# Patient Record
Sex: Male | Born: 1935
Health system: Southern US, Community
[De-identification: ages and names within clinical notes are randomized; demographics above are authoritative.]

## PROBLEM LIST (undated history)

## (undated) DIAGNOSIS — F419 Anxiety disorder, unspecified: Secondary | ICD-10-CM

## (undated) DIAGNOSIS — F329 Major depressive disorder, single episode, unspecified: Secondary | ICD-10-CM

## (undated) DIAGNOSIS — F32A Depression, unspecified: Secondary | ICD-10-CM

## (undated) DIAGNOSIS — E785 Hyperlipidemia, unspecified: Secondary | ICD-10-CM

## (undated) DIAGNOSIS — I10 Essential (primary) hypertension: Secondary | ICD-10-CM

## (undated) DIAGNOSIS — G4730 Sleep apnea, unspecified: Secondary | ICD-10-CM

## (undated) DIAGNOSIS — K219 Gastro-esophageal reflux disease without esophagitis: Secondary | ICD-10-CM

## (undated) HISTORY — DX: Hyperlipidemia, unspecified: E78.5

## (undated) HISTORY — DX: Sleep apnea, unspecified: G47.30

## (undated) HISTORY — DX: Anxiety disorder, unspecified: F41.9

## (undated) HISTORY — PX: WISDOM TOOTH EXTRACTION: SHX21

## (undated) HISTORY — DX: Essential (primary) hypertension: I10

## (undated) HISTORY — DX: Gastro-esophageal reflux disease without esophagitis: K21.9

## (undated) HISTORY — DX: Major depressive disorder, single episode, unspecified: F32.9

## (undated) HISTORY — DX: Depression, unspecified: F32.A

---

## 1995-02-27 HISTORY — PX: CYSTOSCOPY PROSTATE W/ LASER: SUR372

## 1997-11-04 ENCOUNTER — Ambulatory Visit (HOSPITAL_COMMUNITY): Admission: RE | Admit: 1997-11-04 | Discharge: 1997-11-04 | Payer: Self-pay | Admitting: Urology

## 2010-02-26 HISTORY — PX: BACK SURGERY: SHX140

## 2013-10-13 ENCOUNTER — Encounter: Payer: Self-pay | Admitting: Podiatrist

## 2013-10-13 ENCOUNTER — Ambulatory Visit (INDEPENDENT_AMBULATORY_CARE_PROVIDER_SITE_OTHER): Payer: Non-veteran care | Admitting: Podiatrist

## 2013-10-13 VITALS — BP 164/77 | HR 56 | Resp 18

## 2013-10-13 DIAGNOSIS — B351 Tinea unguium: Secondary | ICD-10-CM | POA: Diagnosis not present

## 2013-10-13 DIAGNOSIS — M79609 Pain in unspecified limb: Secondary | ICD-10-CM

## 2013-10-13 DIAGNOSIS — M79676 Pain in unspecified toe(s): Principal | ICD-10-CM

## 2013-10-13 NOTE — Progress Notes (Signed)
   Subjective:    Patient ID: Brian Jennings, male    DOB: 08-03-1934, 79 y.o.   MRN: [id number]  [facility] AND TRIM MY NAILS AND MY TOENAILS CURL IN AND DR WILSON CLIPS THEM AND I AM NOT A DIABETIC AND MY FEET STAY COLD    Review of Systems  HENT: Positive for hearing loss and sinus pressure.   Respiratory: Positive for cough.        DIFFICULTY BREATHING  Cardiovascular:       SWELLING  Gastrointestinal:       BLADDER PROBLEM   Musculoskeletal:       GOUT AND MUSCLE PAIN   Hematological: Bruises/bleeds easily.  All other systems reviewed and are negative.      Objective:   Physical Exam  Patient is awake, alert, and oriented x 3.  In no acute distress.  Vascular status is intact with palpable pedal pulses at 2/4 DP and PT bilateral and capillary refill time within normal limits. Neurological sensation is also intact bilaterally via Semmes Weinstein monofilament at 4/5 sites. Light touch, vibratory sensation, Achilles tendon reflex is intact. Patient relates that his feet stay cool feeling to him. Dermatological exam reveals skin color, turger and texture as normal. No open lesions present. Digits toenails are elongated, incurvated, discolored, dystrophic and clinically mycotic with incurvated borders bilateral. Musculature intact with dorsiflexion, plantarflexion, inversion, eversion.     Assessment & Plan:  Symptomatic onychomycosis  Plan: Debridement of the nails was accomplished today without complication. He will be seen back for routine care. He [facility] but would prefer to come to our office for care. If any problems or concerns arise in the future he will call otherwise he'll be seen back in 3 months.

## 2013-10-20 ENCOUNTER — Encounter: Payer: Self-pay | Admitting: Emergency Medicine

## 2013-10-20 ENCOUNTER — Ambulatory Visit (INDEPENDENT_AMBULATORY_CARE_PROVIDER_SITE_OTHER): Payer: PRIVATE HEALTH INSURANCE | Admitting: Emergency Medicine

## 2013-10-20 VITALS — BP 124/78 | HR 55 | Ht 71.0 in | Wt 253.8 lb

## 2013-10-20 DIAGNOSIS — R0609 Other forms of dyspnea: Secondary | ICD-10-CM

## 2013-10-20 DIAGNOSIS — R0600 Dyspnea, unspecified: Secondary | ICD-10-CM

## 2013-10-20 DIAGNOSIS — R0989 Other specified symptoms and signs involving the circulatory and respiratory systems: Secondary | ICD-10-CM

## 2013-10-20 MED ORDER — FLUTICASONE PROPIONATE 50 MCG/ACT NA SUSP: 2.0000 | Freq: Every day | NASAL | Status: DC

## 2013-10-20 NOTE — Assessment & Plan Note (Addendum)
SOB that has been progressive in a never smoker. Cards eval was reassuring. I suspect that much of his SOB is due to deconditioning and obesity / wt gain. Will need to r/o AFL, see PFT and CXR. Will obtain studies from the [place] and [place]   We will perform full pulmonary function testing We will get your records and films from the [place] for review Stop taking combivent for now Start taking loratadine every day until next visit Start taking fluticasone nasal spray, 2 sprays each nostril once a day Follow with Dr Delton Coombes next available with PFT.

## 2013-10-20 NOTE — Progress Notes (Signed)
Subjective:    Patient ID: Brian Jennings, male    DOB: 01-07-1935, 79 y.o.   MRN: [id number]  HPI 79 yo man, never smoker, hx obesity, HTN, OSA on CPAP since 3-4 yrs. Followed at the [place]. He is referred for dyspnea. He has been having progressive exertional SOB for about 3-4 years. He relates in time to when he had a spinal surgery at the [facility]. He has gained about 80 lbs since then. His energy is low. He hears wheezing at times, usually when he lays in recliner.  He believes that he has had PFT at the [place]. He has been tried on combivent, but rarely uses and he does not see any difference when he uses. He coughs occasionally.  He has seen the cardiologist in [place] > he had a nuclear stress test last month that was reassuring. He has a lot of nasal congestion, especially when he wakes up.    Review of Systems  Constitutional: Negative for fever and unexpected weight change.  HENT: Positive for congestion and sneezing. Negative for dental problem, ear pain, nosebleeds, postnasal drip, rhinorrhea, sinus pressure, sore throat and trouble swallowing.   Eyes: Negative for redness and itching.  Respiratory: Positive for shortness of breath. Negative for cough, chest tightness and wheezing.   Cardiovascular: Negative for palpitations and leg swelling.  Gastrointestinal: Negative for nausea and vomiting.  Genitourinary: Negative for dysuria.  Musculoskeletal: Positive for arthralgias and joint swelling.  Skin: Negative for rash.  Neurological: Negative for headaches.  Hematological: Does not bruise/bleed easily.  Psychiatric/Behavioral: Positive for dysphoric mood. The patient is nervous/anxious.     Past Medical History  Diagnosis Date  . Depression   . GERD (gastroesophageal reflux disease)   . Anxiety   . Sleep apnea     treatment with CPAP  . Hyperlipidemia   . HTN (hypertension)      Family History  Problem Relation Age of Onset  . Heart disease Father   . Pancreatic cancer  Brother      History   Social History  . Marital Status: Single    Spouse Name: N/A    Number of Children: N/A  . Years of Education: N/A   Occupational History  . Retired    Social History Main Topics  . Smoking status: Never Smoker   . Smokeless tobacco: Never Used  . Alcohol Use: Yes     Comment: mild   . Drug Use: No  . Sexual Activity: Not on file   Other Topics Concern  . Not on file   Social History Narrative  . No narrative on file     No Known Allergies   Outpatient Prescriptions Prior to Visit  Medication Sig Dispense Refill  . ciclopirox (PENLAC) 8 % solution       . gabapentin (NEURONTIN) 300 MG capsule       . lidocaine (LIDODERM) 5 % 2 times a day      . methocarbamol (ROBAXIN) 750 MG tablet 4 times a day      . morphine (MSIR) 15 MG tablet 2 times a day as needed      . oxycodone (OXY-IR) 5 MG capsule 4 times a day as needed      . pregabalin (LYRICA) 50 MG capsule 3 times a day       No facility-administered medications prior to visit.         Objective:   Physical Exam Filed Vitals:   10/20/13 1630  BP: 124/78  Pulse: 55  Height:  (1.803 m)  Weight: 253 lb 12.8 oz (115.123 kg)  SpO2: 95%   Gen: Pleasant, obese man, in no distress,  normal affect  ENT: No lesions,  mouth clear,  oropharynx clear, no postnasal drip  Neck: large, No JVD, no TMG, no carotid bruits  Lungs: No use of accessory muscles, clear without rales or rhonchi  Cardiovascular: RRR, heart sounds normal, no murmur or gallops, no peripheral edema  Musculoskeletal: No deformities, no cyanosis or clubbing  Neuro: alert, non focal  Skin: Warm, no lesions or rashes       Assessment & Plan:  Dyspnea SOB that has been progressive in a never smoker. Cards eval was reassuring. I suspect that much of his SOB is due to deconditioning and obesity / wt gain. Will need to r/o AFL, see PFT and CXR. Will obtain studies from the [place] and [place]   We will perform  full pulmonary function testing We will get your records and films from the [place] for review Stop taking combivent for now Start taking loratadine every day until next visit Start taking fluticasone nasal spray, 2 sprays each nostril once a day Follow with Dr Delton Coombes next available with PFT.

## 2013-10-20 NOTE — Patient Instructions (Addendum)
We will perform full pulmonary function testing We will get your records and films from the [place] for review Stop taking combivent for now Start taking loratadine every day until next visit Start taking fluticasone nasal spray, 2 sprays each nostril once a day Follow with Dr Delton Coombes next available with PFT.

## 2013-10-21 ENCOUNTER — Telehealth: Payer: Self-pay | Admitting: Emergency Medicine

## 2013-10-21 NOTE — Telephone Encounter (Signed)
I called to verify the medication because as spelled I cannot find in the database. It is spelled Mirabegron . This has been added to the pt med list. I will forward to RB as an FYI. Carron Curie, CMA

## 2013-10-22 NOTE — Telephone Encounter (Signed)
Thank you :)

## 2013-10-30 ENCOUNTER — Ambulatory Visit (INDEPENDENT_AMBULATORY_CARE_PROVIDER_SITE_OTHER): Payer: PRIVATE HEALTH INSURANCE | Admitting: Emergency Medicine

## 2013-10-30 ENCOUNTER — Ambulatory Visit (INDEPENDENT_AMBULATORY_CARE_PROVIDER_SITE_OTHER): Payer: Non-veteran care | Admitting: Emergency Medicine

## 2013-10-30 ENCOUNTER — Encounter: Payer: Self-pay | Admitting: Emergency Medicine

## 2013-10-30 ENCOUNTER — Ambulatory Visit (INDEPENDENT_AMBULATORY_CARE_PROVIDER_SITE_OTHER)
Admission: RE | Admit: 2013-10-30 | Discharge: 2013-10-30 | Disposition: A | Discharge status: Home / Self Care | Payer: PRIVATE HEALTH INSURANCE | Source: Ambulatory Visit | Attending: Emergency Medicine | Admitting: Emergency Medicine

## 2013-10-30 VITALS — BP 134/72 | HR 73 | Ht 68.5 in | Wt 250.0 lb

## 2013-10-30 DIAGNOSIS — R0600 Dyspnea, unspecified: Secondary | ICD-10-CM

## 2013-10-30 DIAGNOSIS — R0609 Other forms of dyspnea: Secondary | ICD-10-CM | POA: Diagnosis not present

## 2013-10-30 DIAGNOSIS — J309 Allergic rhinitis, unspecified: Secondary | ICD-10-CM | POA: Diagnosis not present

## 2013-10-30 DIAGNOSIS — R0989 Other specified symptoms and signs involving the circulatory and respiratory systems: Secondary | ICD-10-CM

## 2013-10-30 LAB — PULMONARY FUNCTION TEST
DL/VA % pred: 100 %
DL/VA: 4.53 ml/min/mmHg/L
DLCO unc % pred: 69 %
DLCO unc: 21.1 ml/min/mmHg
FEF 25-75 Post: 2.43 L/sec
FEF 25-75 Pre: 1.61 L/sec
FEF2575-%Change-Post: 51 %
FEF2575-%Pred-Post: 123 %
FEF2575-%Pred-Pre: 81 %
FEV1-%Change-Post: 8 %
FEV1-%Pred-Post: 69 %
FEV1-%Pred-Pre: 64 %
FEV1-Post: 1.93 L
FEV1-Pre: 1.78 L
FEV1FVC-%Change-Post: 8 %
FEV1FVC-%Pred-Pre: 107 %
FEV6-%Change-Post: 1 %
FEV6-%Pred-Post: 62 %
FEV6-%Pred-Pre: 61 %
FEV6-Post: 2.28 L
FEV6-Pre: 2.25 L
FEV6FVC-%Change-Post: 2 %
FEV6FVC-%Pred-Post: 107 %
FEV6FVC-%Pred-Pre: 105 %
FVC-%Change-Post: 0 %
FVC-%Pred-Post: 58 %
FVC-%Pred-Pre: 58 %
FVC-Post: 2.28 L
FVC-Pre: 2.29 L
Post FEV1/FVC ratio: 84 %
Post FEV6/FVC ratio: 100 %
Pre FEV1/FVC ratio: 78 %
Pre FEV6/FVC Ratio: 98 %

## 2013-10-30 IMAGING — CR DG CHEST 2V
2 series · 2 of 2 positions shown · non-contrast
Comparison: None.

CLINICAL DATA: 79-year-old female with shortness of breath.
Hypertension. Initial encounter.

EXAM:
CHEST  2 VIEW

[view not recorded (1 of 2)]
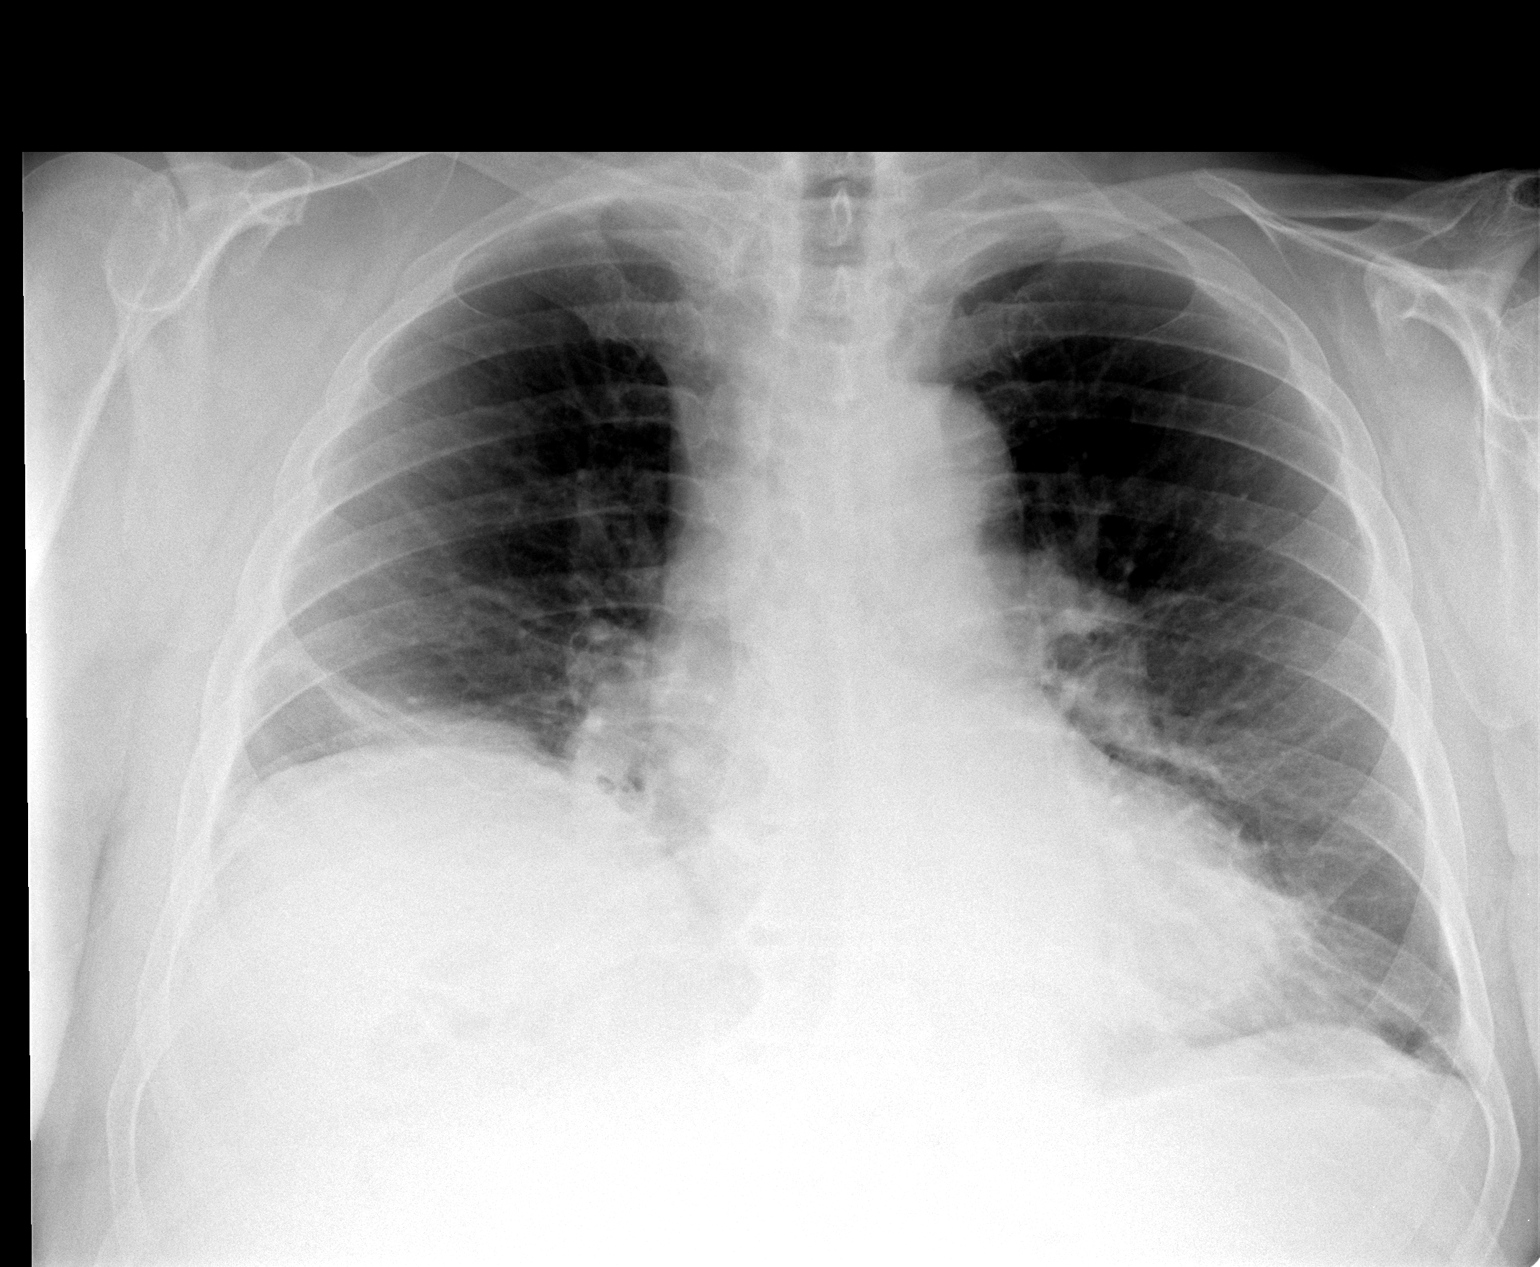

[view not recorded (2 of 2)]
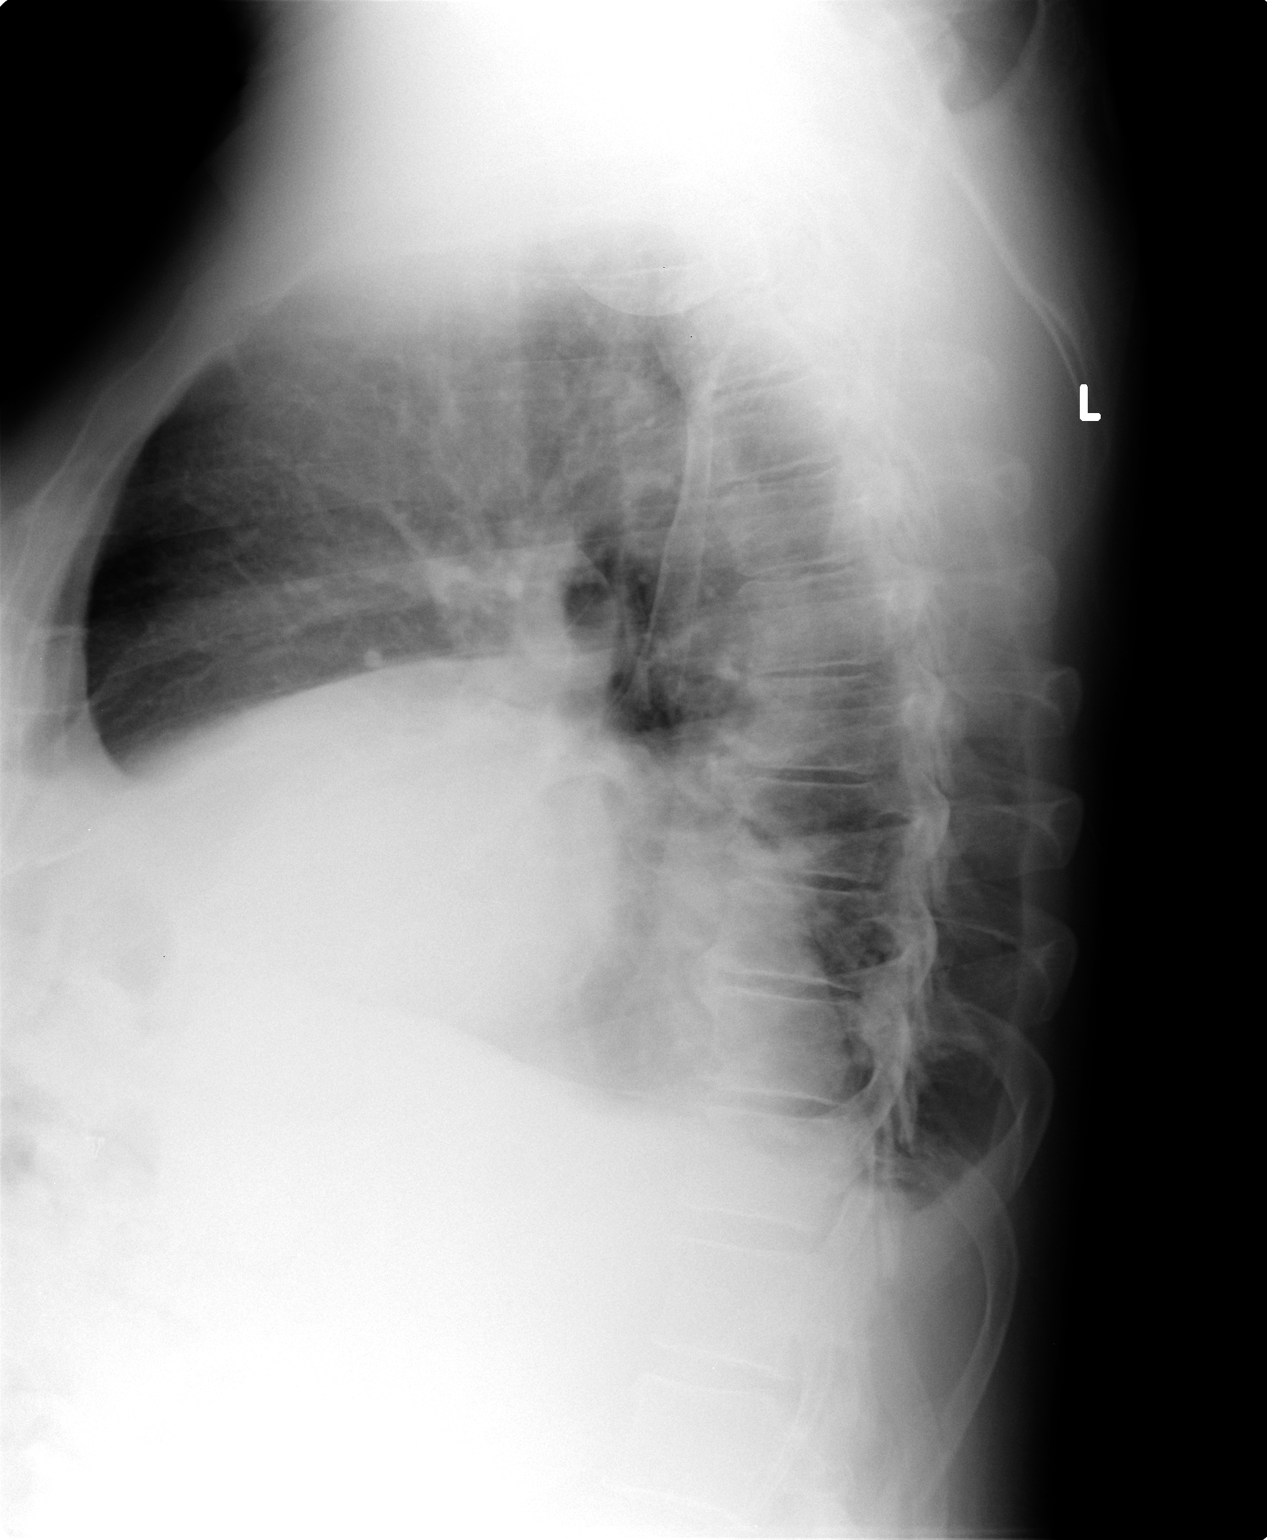

[2 of 2 positions shown; findings below may reference images not displayed]

FINDINGS: Moderate elevation of the right hemidiaphragm with adjacent
curvilinear pulmonary opacity, which most resembles atelectasis. No
pleural effusion. Minimal streaky opacity at the left lung base.
Cardiac size at the upper limits of normal. Other mediastinal
contours are within normal limits. Visualized tracheal air column is
within normal limits. No pneumothorax or pulmonary edema. No acute
osseous abnormality identified.
IMPRESSION: 1. Moderate elevation of the right hemidiaphragm with associated
atelectasis. This may indicate right phrenic nerve dysfunction.
2. Mild left lung base opacity, also favored to be atelectasis.
3. Mild cardiomegaly.

## 2013-10-30 NOTE — Assessment & Plan Note (Signed)
Continue loratadine and nasal steroid 

## 2013-10-30 NOTE — Progress Notes (Signed)
Subjective:    Patient ID: Brian Jennings, male    DOB: September 22, 1934, 79 y.o.   MRN: [id number]  HPI 79 yo man, never smoker, hx obesity, HTN, OSA on CPAP since 3-4 yrs. Followed at the [place]. He is referred for dyspnea. He has been having progressive exertional SOB for about 3-4 years. He relates in time to when he had a spinal surgery at the [facility]. He has gained about 80 lbs since then. His energy is low. He hears wheezing at times, usually when he lays in recliner.  He believes that he has had PFT at the [place]. He has been tried on combivent, but rarely uses and he does not see any difference when he uses. He coughs occasionally.  He has seen the cardiologist in [place] > he had a nuclear stress test last month that was reassuring. He has a lot of nasal congestion, especially when he wakes up.   ROV 10/30/13 -- follow up visit for SOB. He underwent PFT today > shows evidence for possible mixed disease, primarily restriction.  He returns with no changes. He hasn't used combivent, hasn't missed it. He is doing loratadine and nasal steroid, seems to be helping.    Review of Systems  Constitutional: Negative for fever and unexpected weight change.  HENT: Positive for congestion and sneezing. Negative for dental problem, ear pain, nosebleeds, postnasal drip, rhinorrhea, sinus pressure, sore throat and trouble swallowing.   Eyes: Negative for redness and itching.  Respiratory: Positive for shortness of breath. Negative for cough, chest tightness and wheezing.   Cardiovascular: Negative for palpitations and leg swelling.  Gastrointestinal: Negative for nausea and vomiting.  Genitourinary: Negative for dysuria.  Musculoskeletal: Positive for arthralgias and joint swelling.  Skin: Negative for rash.  Neurological: Negative for headaches.  Hematological: Does not bruise/bleed easily.  Psychiatric/Behavioral: Positive for dysphoric mood. The patient is nervous/anxious.     Past Medical History  Diagnosis  Date  . Depression   . GERD (gastroesophageal reflux disease)   . Anxiety   . Sleep apnea     treatment with CPAP  . Hyperlipidemia   . HTN (hypertension)      Family History  Problem Relation Age of Onset  . Heart disease Father   . Pancreatic cancer Brother      History   Social History  . Marital Status: Single    Spouse Name: N/A    Number of Children: N/A  . Years of Education: N/A   Occupational History  . Retired    Social History Main Topics  . Smoking status: Never Smoker   . Smokeless tobacco: Never Used  . Alcohol Use: Yes     Comment: mild   . Drug Use: No  . Sexual Activity: Not on file   Other Topics Concern  . Not on file   Social History Narrative  . No narrative on file     No Known Allergies   Outpatient Prescriptions Prior to Visit  Medication Sig Dispense Refill  . aspirin 81 MG tablet Take 81 mg by mouth daily.      . Cholecalciferol 2000 UNITS CAPS Take 1 capsule by mouth daily.      . citalopram (CELEXA) 40 MG tablet Take 40 mg by mouth daily as needed.      . donepezil (ARICEPT) 10 MG tablet Take 10 mg by mouth at bedtime.      . famciclovir (FAMVIR) 500 MG tablet Take 500 mg by mouth 3 (three)  times daily.      . fluticasone (FLONASE) 50 MCG/ACT nasal spray Place 2 sprays into both nostrils daily.  16 g  11  . levothyroxine (SYNTHROID, LEVOTHROID) 50 MCG tablet Take 50 mcg by mouth daily before breakfast.      . lisinopril (PRINIVIL,ZESTRIL) 40 MG tablet Take 40 mg by mouth daily.      Marland Kitchen loratadine (CLARITIN) 10 MG tablet Take 10 mg by mouth daily.      Marland Kitchen MENTHOL-METHYL SALICYLATE EX Apply topically 4 (four) times daily.      . mirabegron ER (MYRBETRIQ) 25 MG TB24 tablet Take 25 mg by mouth daily.      . potassium chloride (MICRO-K) 10 MEQ CR capsule Take 10 mEq by mouth 2 (two) times daily.      . pravastatin (PRAVACHOL) 40 MG tablet Take 40 mg by mouth daily.      . QUEtiapine (SEROQUEL) 25 MG tablet Take 25 mg by mouth as needed  (insomnia).      . ranitidine (ZANTAC) 150 MG capsule Take 150 mg by mouth 2 (two) times daily.       No facility-administered medications prior to visit.         Objective:   Physical Exam Filed Vitals:   10/30/13 1428  BP: 134/72  Pulse: 73  Height: 5' 8.5" (1.74 m)  Weight: 250 lb (113.399 kg)  SpO2: 95%   Gen: Pleasant, obese man, in no distress,  normal affect  ENT: No lesions,  mouth clear,  oropharynx clear, no postnasal drip  Neck: large, No JVD, no TMG, no carotid bruits  Lungs: No use of accessory muscles, clear without rales or rhonchi  Cardiovascular: RRR, heart sounds normal, no murmur or gallops, no peripheral edema  Musculoskeletal: No deformities, no cyanosis or clubbing  Neuro: alert, non focal  Skin: Warm, no lesions or rashes       Assessment & Plan:  Dyspnea PFT show some evidence for mixed disease but primarily restriction. Given his failure to respond to Combivent I suspect that his shortness of breath is largely due to his obesity and deconditioning. I encouraged him to start working on his diet and exercise  Allergic rhinitis Continue loratadine and nasal steroid

## 2013-10-30 NOTE — Patient Instructions (Addendum)
Please continue your nasal spray and loratadine.  You may use your combivent as needed You need to work on weight loss and exercise.  Wear your CPAP every night Follow with Dr Delton Coombes in 6 months or sooner if you have any problems

## 2013-10-30 NOTE — Progress Notes (Signed)
PFT done today. 

## 2013-10-30 NOTE — Assessment & Plan Note (Signed)
PFT show some evidence for mixed disease but primarily restriction. Given his failure to respond to Combivent I suspect that his shortness of breath is largely due to his obesity and deconditioning. I encouraged him to start working on his diet and exercise

## 2013-12-15 ENCOUNTER — Ambulatory Visit (INDEPENDENT_AMBULATORY_CARE_PROVIDER_SITE_OTHER): Payer: Non-veteran care | Admitting: Podiatrist

## 2013-12-15 ENCOUNTER — Encounter: Payer: Self-pay | Admitting: Podiatrist

## 2013-12-15 DIAGNOSIS — B351 Tinea unguium: Secondary | ICD-10-CM | POA: Diagnosis not present

## 2013-12-15 DIAGNOSIS — M79676 Pain in unspecified toe(s): Secondary | ICD-10-CM

## 2013-12-15 NOTE — Progress Notes (Signed)
Subjective: Patient presents today for painful toenails. Relates the left hallux nail was painful on a recent trip to [place] but then got better. His tried Lamisil in the past and states the fungus came back on his toenails.  Physical Exam  Patient is awake, alert, and oriented x 3. In no acute distress. Vascular status is intact with palpable pedal pulses at 2/4 DP and PT bilateral and capillary refill time within normal limits. Neurological sensation is also intact bilaterally via Semmes Weinstein monofilament at 4/5 sites. Light touch, vibratory sensation, Achilles tendon reflex is intact. Patient relates that his feet stay cool feeling to him. Dermatological exam reveals skin color, turger and texture as normal. No open lesions present. Digits toenails are elongated, incurvated, discolored, dystrophic and clinically mycotic with incurvated borders bilateral- especially bilateral hallux nails. Musculature intact with dorsiflexion, plantarflexion, inversion, eversion.  Assessment & Plan:   Symptomatic onychomycosis  Plan: Debridement of the nails was accomplished today without complication. He will be seen back for routine care. [facility] but would prefer to come to our office for care. If any problems or concerns arise in the future he will call otherwise he'll be seen back in 3 months.

## 2013-12-29 ENCOUNTER — Other Ambulatory Visit: Payer: Self-pay | Admitting: Internal Medicine

## 2013-12-29 DIAGNOSIS — M545 Low back pain, unspecified: Secondary | ICD-10-CM

## 2013-12-29 DIAGNOSIS — G8929 Other chronic pain: Secondary | ICD-10-CM

## 2013-12-31 ENCOUNTER — Other Ambulatory Visit: Payer: Non-veteran care

## 2014-01-04 ENCOUNTER — Other Ambulatory Visit: Payer: Non-veteran care

## 2014-01-19 ENCOUNTER — Ambulatory Visit: Payer: Non-veteran care | Admitting: Podiatrist

## 2014-02-15 ENCOUNTER — Ambulatory Visit (INDEPENDENT_AMBULATORY_CARE_PROVIDER_SITE_OTHER): Payer: Non-veteran care

## 2014-02-15 VITALS — BP 140/78 | HR 84 | Resp 12

## 2014-02-15 DIAGNOSIS — B351 Tinea unguium: Secondary | ICD-10-CM

## 2014-02-15 DIAGNOSIS — M79676 Pain in unspecified toe(s): Secondary | ICD-10-CM | POA: Diagnosis not present

## 2014-02-15 NOTE — Progress Notes (Signed)
   Subjective:    Patient ID: Brian Jennings, male    DOB: Oct 14, 1934, 79 y.o.   MRN: [id number]  HPI  TOENAILS TRIM.  Review of Systems no new findings or systemic changes noted    Objective:   Physical Exam Neurovascular status is intact DP +2 PT plus one over 4 bilateral capillary fill time 3 seconds all digits epicritic sensations intact except for distal forefoot digits. There is normal plantar response DTRs not listed dermatologic the skin color pigment normal hair growth absent nails thick criptotic incurvated particular hallux nails bilateral with yellowing discoloration brittleness and friability consistent with onychomycosis. Proptosis left more so than right painful with ambulation and on palpation and attempted debridement.       Assessment & Plan:  Assessment onychomycosis brittle Crumley friable dystrophic incurvated nails debrided on the presence of pain symptomology hallux second third and fourth digits bilateral tender painful symptomatic with friability discoloration and tenderness on palpation and debridement ambulation return for future palliative care is needed recommended 2-3 month follow-up  Alvan Dameichard Aubreyana Saltz DPM

## 2014-05-10 ENCOUNTER — Ambulatory Visit (INDEPENDENT_AMBULATORY_CARE_PROVIDER_SITE_OTHER): Payer: Non-veteran care

## 2014-05-10 VITALS — BP 155/92 | HR 73 | Resp 18

## 2014-05-10 DIAGNOSIS — M79676 Pain in unspecified toe(s): Secondary | ICD-10-CM

## 2014-05-10 DIAGNOSIS — B351 Tinea unguium: Secondary | ICD-10-CM

## 2014-05-10 NOTE — Progress Notes (Signed)
   Subjective:    Patient ID: Brian Jennings, male    DOB: [birth year], 80 y.o.   MRN: [id number]  HPI I AM HERE TO GET MY TOENAILS TRIMMED UP    Review of Systems no new findings or systemic changes noted     Objective:   Physical Exam Neurovascular status is unchanged DP +2 PT 1 over 4 bilateral Refill time 3 seconds there is decreased epicritic sensation Phoebe PerchSemmes Weinstein there is no plantar response the dry eschar or blister medial hallux IP joint left did a lot of walking when he was in [place] nails thick brittle Crumley friable dystrophic 1 through 5 bilateral. Especially the hallux left extremely criptotic and incurvated third toe left and hallux left are treated with lidocaine Neosporin following debridement. M otherwise unremarkable mild semirigid digital contractures are identified no open wounds no ulcers are noted       Assessment & Plan:  Assessment this time is onychomycosis painful incurvated crumbly friable mycotic nails debrided 1 through 5 bilateral return to 3 months for continued palliative care is needed  Alvan Dameichard Eleanor Gatliff DPM

## 2014-06-03 ENCOUNTER — Telehealth: Payer: Self-pay | Admitting: Emergency Medicine

## 2014-06-03 NOTE — Telephone Encounter (Signed)
G[place] info to [place]. Message not needed/spm

## 2014-07-15 ENCOUNTER — Ambulatory Visit: Payer: Non-veteran care | Admitting: Emergency Medicine

## 2014-07-27 ENCOUNTER — Ambulatory Visit (INDEPENDENT_AMBULATORY_CARE_PROVIDER_SITE_OTHER): Payer: Medicare HMO | Admitting: Emergency Medicine

## 2014-07-27 ENCOUNTER — Encounter: Payer: Self-pay | Admitting: Emergency Medicine

## 2014-07-27 VITALS — BP 144/68 | HR 62 | Ht 70.0 in | Wt 249.0 lb

## 2014-07-27 DIAGNOSIS — J309 Allergic rhinitis, unspecified: Secondary | ICD-10-CM | POA: Diagnosis not present

## 2014-07-27 DIAGNOSIS — R0600 Dyspnea, unspecified: Secondary | ICD-10-CM | POA: Diagnosis not present

## 2014-07-27 MED ORDER — FLUTICASONE PROPIONATE 50 MCG/ACT NA SUSP: 2.0000 | Freq: Two times a day (BID) | NASAL | Status: AC

## 2014-07-27 NOTE — Patient Instructions (Signed)
Please continue to work on increasing her exercise. Weight loss is going to help with your short windedness significantly. Continue cetirizine 10 mg once a day Increase your fluticasone nasal spray to 2 sprays each nostril twice a day Start using nasal saline washes once a day We will investigate getting your VA authorization number corrected so that your billing will be [facility] Follow with Dr Delton CoombesByrum in 6 months or sooner if you have any problems

## 2014-07-27 NOTE — Assessment & Plan Note (Signed)
We discussed in detail today. I reviewed his old notes and his old pulmonary function testing and imaging. I still believe that a lot of his dyspnea is due to restrictive lung disease and obesity. He has not lost any weight and his symptoms are largely unchanged. He states that some of his problem is moving air through his nose which limits his ability to exercise. I hope that treating his allergic rhinitis may allow him to increase his exercise. He will work on weight loss with a goal of 5 pounds by our next visit

## 2014-07-27 NOTE — Progress Notes (Signed)
Subjective:    Patient ID: Brian Jennings, male    DOB: 24-Jun-1934, 80 y.o.   MRN: [id number]  HPI 80 yo man, never smoker, hx obesity, HTN, OSA on CPAP since 3-4 yrs. [facility]. He is referred for dyspnea. He has been having progressive exertional SOB for about 3-4 years. He relates in time to when he had a [facility]. He has gained about 80 lbs since then. His energy is low. He hears wheezing at times, usually when he lays in recliner.  He believes that he has had [facility]FT [facility]. He has been tried on combivent, but rarely uses and he does not see any difference when he uses. He coughs occasionally.  He has seen the cardiologist in [place] > he had a nuclear stress test last month that was reassuring. He has a lot of nasal congestion, especially when he wakes up.   ROV 10/30/13 -- follow up visit for SOB. He underwent PFT today > shows evidence for possible mixed disease, primarily restriction.  He returns with no changes. He hasn't used combivent, hasn't missed it. He is doing loratadine and nasal steroid, seems to be helping.   ROV 07/27/14 -- Follow-up visit for exertional dyspnea, nasal allergies, obstructive sleep apnea, obesity. He his undergone pulmonary function testing that shows restriction and some possible coexisting obstruction.  He tells me today that he continues to have exertional dyspnea, his wt is unchanged. He uses his CPAP reliably. He is also having more trouble with nasal congestion, some occasional cough. Does not use bronchodilators as they have never helped him   Review of Systems  Constitutional: Negative for fever and unexpected weight change.  HENT: Positive for congestion. Negative for dental problem, ear pain, nosebleeds, postnasal drip, rhinorrhea, sinus pressure, sneezing, sore throat and trouble swallowing.   Eyes: Negative for redness and itching.  Respiratory: Positive for shortness of breath. Negative for cough, chest tightness and  wheezing.   Cardiovascular: Negative for palpitations and leg swelling.  Gastrointestinal: Negative for nausea and vomiting.  Genitourinary: Negative for dysuria.  Musculoskeletal: Negative for joint swelling and arthralgias.  Skin: Negative for rash.  Neurological: Negative for headaches.  Hematological: Does not bruise/bleed easily.  Psychiatric/Behavioral: Negative for dysphoric mood. The patient is not nervous/anxious.         Objective:   Physical Exam Filed Vitals:   07/27/14 1102  BP: 144/68  Pulse: 62  Height: 5\' 10"  (1.778 m)  Weight: 249 lb (112.946 kg)  SpO2: 94%   Gen: Pleasant, obese man, in no distress,  normal affect  ENT: No lesions,  mouth clear,  oropharynx clear, no postnasal drip  Neck: large, No JVD, no TMG, no carotid bruits  Lungs: No use of accessory muscles, clear without rales or rhonchi  Cardiovascular: RRR, heart sounds normal, no murmur or gallops, no peripheral edema  Musculoskeletal: No deformities, no cyanosis or clubbing  Neuro: alert, non focal  Skin: Warm, no lesions or rashes       Assessment & Plan:  Dyspnea We discussed in detail today. I reviewed his old notes and his old pulmonary function testing and imaging. I still believe that a lot of his dyspnea is due to restrictive lung disease and obesity. He has not lost any weight and his symptoms are largely unchanged. He states that some of his problem is moving air through his nose which limits his ability to exercise. I hope that treating his allergic rhinitis  may allow him to increase his exercise. He will work on weight loss with a goal of 5 pounds by our next visit   Allergic rhinitis Remains poorly controlled. I will increase his fluticasone nasal spray to twice a day, asked him to add nasal saline washes once a day. Continue cetirizine

## 2014-07-27 NOTE — Assessment & Plan Note (Signed)
Remains poorly controlled. I will increase his fluticasone nasal spray to twice a day, asked him to add nasal saline washes once a day. Continue cetirizine

## 2014-08-09 ENCOUNTER — Telehealth: Payer: Self-pay | Admitting: *Deleted

## 2014-08-09 NOTE — Telephone Encounter (Signed)
Pt is wanting to make sure we get the correct authorization # for his visits prior to him being seen by the [place]. He reports he has been having problems with billing. FYI for our office for future appts.

## 2014-08-16 ENCOUNTER — Ambulatory Visit: Payer: Non-veteran care

## 2014-08-16 ENCOUNTER — Ambulatory Visit: Payer: Non-veteran care | Admitting: Podiatry

## 2014-08-24 ENCOUNTER — Other Ambulatory Visit: Payer: Self-pay | Admitting: Internal Medicine

## 2014-08-24 DIAGNOSIS — M542 Cervicalgia: Secondary | ICD-10-CM

## 2014-08-28 ENCOUNTER — Ambulatory Visit
Admission: RE | Admit: 2014-08-28 | Discharge: 2014-08-28 | Disposition: A | Discharge status: Home / Self Care | Payer: Medicare HMO | Source: Ambulatory Visit | Attending: Internal Medicine | Admitting: Internal Medicine

## 2014-08-28 DIAGNOSIS — M542 Cervicalgia: Secondary | ICD-10-CM

## 2014-09-22 ENCOUNTER — Ambulatory Visit: Payer: Non-veteran care | Admitting: Podiatry

## 2014-09-27 ENCOUNTER — Encounter: Payer: Self-pay | Admitting: Podiatry

## 2014-09-27 ENCOUNTER — Ambulatory Visit (INDEPENDENT_AMBULATORY_CARE_PROVIDER_SITE_OTHER): Payer: Non-veteran care | Admitting: Podiatry

## 2014-09-27 DIAGNOSIS — M79676 Pain in unspecified toe(s): Secondary | ICD-10-CM

## 2014-09-27 DIAGNOSIS — B351 Tinea unguium: Secondary | ICD-10-CM

## 2014-09-27 NOTE — Progress Notes (Signed)
Patient ID: Brian Jennings, male   DOB: Sep 17, 1934, 80 y.o.   MRN: [id number] Complaint:  Visit Type: Patient returns to my office for continued preventative foot care services. Complaint: Patient states" my nails have grown long and thick and become painful to walk and wear shoes" Patient has been diagnosed with DM with no foot complications. The patient presents for preventative foot care services. No changes to ROS  Podiatric Exam: Vascular: dorsalis pedis and posterior tibial pulses are palpable bilateral. Capillary return is immediate. Temperature gradient is WNL. Skin turgor WNL  Sensorium: Diminished  Semmes Weinstein monofilament test. Normal tactile sensation bilaterally. Nail Exam: Pt has thick disfigured discolored nails with subungual debris noted bilateral entire nail hallux through fifth toenails Ulcer Exam: There is no evidence of ulcer or pre-ulcerative changes or infection. Orthopedic Exam: Muscle tone and strength are WNL. No limitations in general ROM. No crepitus or effusions noted. Foot type and digits show no abnormalities. Bony prominences are unremarkable. Skin: No Porokeratosis. No infection or ulcers  Diagnosis:  Onychomycosis, , Pain in right toe, pain in left toes  Treatment & Plan Procedures and Treatment: Consent by patient was obtained for treatment procedures. The patient understood the discussion of treatment and procedures well. All questions were answered thoroughly reviewed. Debridement of mycotic and hypertrophic toenails, 1 through 5 bilateral and clearing of subungual debris. No ulceration, no infection noted.  Return Visit-Office Procedure: Patient instructed to return to the office for a follow up visit 3 months for continued evaluation and treatment.

## 2014-10-20 ENCOUNTER — Other Ambulatory Visit: Payer: Self-pay | Admitting: Internal Medicine

## 2014-10-20 DIAGNOSIS — G8929 Other chronic pain: Secondary | ICD-10-CM

## 2014-10-20 DIAGNOSIS — M545 Low back pain: Principal | ICD-10-CM

## 2014-10-26 ENCOUNTER — Ambulatory Visit
Admission: RE | Admit: 2014-10-26 | Discharge: 2014-10-26 | Disposition: A | Discharge status: Home / Self Care | Payer: PRIVATE HEALTH INSURANCE | Source: Ambulatory Visit | Attending: Internal Medicine | Admitting: Internal Medicine

## 2014-10-26 DIAGNOSIS — G8929 Other chronic pain: Secondary | ICD-10-CM

## 2014-10-26 DIAGNOSIS — M545 Low back pain: Principal | ICD-10-CM

## 2014-10-26 MED ORDER — IOHEXOL 300 MG/ML  SOLN
1.0000 mL | Freq: Once | INTRAMUSCULAR | Status: DC | PRN
  Administered 2014-10-26: 1 mL via EPIDURAL

## 2014-10-26 MED ORDER — TRIAMCINOLONE ACETONIDE 40 MG/ML IJ SUSP (RADIOLOGY)
60.0000 mg | Freq: Once | INTRAMUSCULAR | Status: AC
  Administered 2014-10-26: 60 mg via EPIDURAL

## 2014-10-26 NOTE — Discharge Instructions (Signed)

## 2014-12-16 DIAGNOSIS — Z23 Encounter for immunization: Secondary | ICD-10-CM | POA: Diagnosis not present

## 2014-12-27 ENCOUNTER — Ambulatory Visit (INDEPENDENT_AMBULATORY_CARE_PROVIDER_SITE_OTHER): Payer: Non-veteran care | Admitting: Podiatry

## 2014-12-27 DIAGNOSIS — M79676 Pain in unspecified toe(s): Secondary | ICD-10-CM | POA: Diagnosis not present

## 2014-12-27 DIAGNOSIS — B351 Tinea unguium: Secondary | ICD-10-CM | POA: Diagnosis not present

## 2014-12-27 NOTE — Progress Notes (Signed)
Patient ID: Brian OmanSamuel Lorge, male   DOB: 03-22-1934, 80 y.o.   MRN: [id number] Complaint:  Visit Type: Patient returns to my office for continued preventative foot care services. Complaint: Patient states" my nails have grown long and thick and become painful to walk and wear shoes". The patient presents for preventative foot care services. No changes to ROS  Podiatric Exam: Vascular: dorsalis pedis and posterior tibial pulses are palpable bilateral. Capillary return is immediate. Temperature gradient is WNL. Skin turgor WNL  Sensorium: Diminished  Semmes Weinstein monofilament test. Normal tactile sensation bilaterally. Nail Exam: Pt has thick disfigured discolored nails with subungual debris noted bilateral entire nail hallux through fifth toenails Ulcer Exam: There is no evidence of ulcer or pre-ulcerative changes or infection. Orthopedic Exam: Muscle tone and strength are WNL. No limitations in general ROM. No crepitus or effusions noted. Foot type and digits show no abnormalities. Bony prominences are unremarkable. Skin: No Porokeratosis. No infection or ulcers  Diagnosis:  Onychomycosis, , Pain in right toe, pain in left toes  Treatment & Plan Procedures and Treatment: Consent by patient was obtained for treatment procedures. The patient understood the discussion of treatment and procedures well. All questions were answered thoroughly reviewed. Debridement of mycotic and hypertrophic toenails, 1 through 5 bilateral and clearing of subungual debris. No ulceration, no infection noted.  Return Visit-Office Procedure: Patient instructed to return to the office for a follow up visit 3 months for continued evaluation and treatment.

## 2015-03-21 DIAGNOSIS — J069 Acute upper respiratory infection, unspecified: Secondary | ICD-10-CM | POA: Diagnosis not present

## 2015-03-21 DIAGNOSIS — Z6835 Body mass index (BMI) 35.0-35.9, adult: Secondary | ICD-10-CM | POA: Diagnosis not present

## 2015-03-21 DIAGNOSIS — I10 Essential (primary) hypertension: Secondary | ICD-10-CM | POA: Diagnosis not present

## 2015-04-04 ENCOUNTER — Encounter: Payer: Self-pay | Admitting: Podiatry

## 2015-04-04 ENCOUNTER — Ambulatory Visit (INDEPENDENT_AMBULATORY_CARE_PROVIDER_SITE_OTHER): Payer: Non-veteran care | Admitting: Podiatry

## 2015-04-04 DIAGNOSIS — B351 Tinea unguium: Secondary | ICD-10-CM | POA: Diagnosis not present

## 2015-04-04 DIAGNOSIS — M79676 Pain in unspecified toe(s): Secondary | ICD-10-CM

## 2015-04-04 NOTE — Progress Notes (Signed)
Patient ID: Brian Jennings, male   DOB: [birth year], 81 y.o.   MRN: [id number] Complaint:  Visit Type: Patient returns to my office for continued preventative foot care services. Complaint: Patient states" my nails have grown long and thick and become painful to walk and wear shoes". The patient presents for preventative foot care services. No changes to ROS  Podiatric Exam: Vascular: dorsalis pedis and posterior tibial pulses are palpable bilateral. Capillary return is immediate. Temperature gradient is WNL. Skin turgor WNL  Sensorium: Diminished  Semmes Weinstein monofilament test. Normal tactile sensation bilaterally. Nail Exam: Pt has thick disfigured discolored nails with subungual debris noted bilateral entire nail hallux through fifth toenails Ulcer Exam: There is no evidence of ulcer or pre-ulcerative changes or infection. Orthopedic Exam: Muscle tone and strength are WNL. No limitations in general ROM. No crepitus or effusions noted. Foot type and digits show no abnormalities. Bony prominences are unremarkable. Skin: No Porokeratosis. No infection or ulcers  Diagnosis:  Onychomycosis, , Pain in right toe, pain in left toes  Treatment & Plan Procedures and Treatment: Consent by patient was obtained for treatment procedures. The patient understood the discussion of treatment and procedures well. All questions were answered thoroughly reviewed. Debridement of mycotic and hypertrophic toenails, 1 through 5 bilateral and clearing of subungual debris. No ulceration, no infection noted.  Return Visit-Office Procedure: Patient instructed to return to the office for a follow up visit 3 months for continued evaluation and treatment.   Helane Gunther DPM

## 2015-07-04 ENCOUNTER — Ambulatory Visit (INDEPENDENT_AMBULATORY_CARE_PROVIDER_SITE_OTHER): Payer: Medicare HMO | Admitting: Podiatry

## 2015-07-04 ENCOUNTER — Encounter: Payer: Self-pay | Admitting: Podiatry

## 2015-07-04 DIAGNOSIS — M79676 Pain in unspecified toe(s): Secondary | ICD-10-CM | POA: Diagnosis not present

## 2015-07-04 DIAGNOSIS — B351 Tinea unguium: Secondary | ICD-10-CM | POA: Diagnosis not present

## 2015-07-04 NOTE — Progress Notes (Signed)
Patient ID: Brian Jennings, male   DOB: 12/11/1934, 81 y.o.   MRN: [id number] Complaint:  Visit Type: Patient returns to my office for continued preventative foot care services. Complaint: Patient states" my nails have grown long and thick and become painful to walk and wear shoes". The patient presents for preventative foot care services. No changes to ROS  Podiatric Exam: Vascular: dorsalis pedis and posterior tibial pulses are palpable bilateral. Capillary return is immediate. Temperature gradient is WNL. Skin turgor WNL  Sensorium: Diminished  Semmes Weinstein monofilament test. Normal tactile sensation bilaterally. Nail Exam: Pt has thick disfigured discolored nails with subungual debris noted bilateral entire nail hallux through fifth toenails Ulcer Exam: There is no evidence of ulcer or pre-ulcerative changes or infection. Orthopedic Exam: Muscle tone and strength are WNL. No limitations in general ROM. No crepitus or effusions noted. Foot type and digits show no abnormalities. Bony prominences are unremarkable. Skin: No Porokeratosis. No infection or ulcers  Diagnosis:  Onychomycosis, , Pain in right toe, pain in left toes  Treatment & Plan Procedures and Treatment: Consent by patient was obtained for treatment procedures. The patient understood the discussion of treatment and procedures well. All questions were answered thoroughly reviewed. Debridement of mycotic and hypertrophic toenails, 1 through 5 bilateral and clearing of subungual debris. No ulceration, no infection noted.  Return Visit-Office Procedure: Patient instructed to return to the office for a follow up visit 3 months for continued evaluation and treatment.   Kiam Bransfield DPM 

## 2015-08-17 ENCOUNTER — Other Ambulatory Visit: Payer: Self-pay | Admitting: Internal Medicine

## 2015-08-17 DIAGNOSIS — M542 Cervicalgia: Secondary | ICD-10-CM

## 2015-08-26 ENCOUNTER — Ambulatory Visit
Admission: RE | Admit: 2015-08-26 | Discharge: 2015-08-26 | Disposition: A | Discharge status: Home / Self Care | Payer: Non-veteran care | Source: Ambulatory Visit | Attending: Internal Medicine | Admitting: Internal Medicine

## 2015-08-26 DIAGNOSIS — M542 Cervicalgia: Secondary | ICD-10-CM

## 2015-08-26 MED ORDER — TRIAMCINOLONE ACETONIDE 40 MG/ML IJ SUSP (RADIOLOGY)
60.0000 mg | Freq: Once | INTRAMUSCULAR | Status: AC
  Administered 2015-08-26: 60 mg via EPIDURAL

## 2015-08-26 MED ORDER — IOPAMIDOL (ISOVUE-M 300) INJECTION 61%
1.0000 mL | Freq: Once | INTRAMUSCULAR | Status: AC | PRN
  Administered 2015-08-26: 1 mL via EPIDURAL

## 2015-08-26 NOTE — Discharge Instructions (Signed)

## 2015-10-10 ENCOUNTER — Ambulatory Visit (INDEPENDENT_AMBULATORY_CARE_PROVIDER_SITE_OTHER): Payer: Medicare HMO | Admitting: Podiatry

## 2015-10-10 DIAGNOSIS — M79676 Pain in unspecified toe(s): Secondary | ICD-10-CM

## 2015-10-10 DIAGNOSIS — B351 Tinea unguium: Secondary | ICD-10-CM

## 2015-10-10 NOTE — Progress Notes (Signed)
Patient ID: Brian Jennings, male   DOB: [birth year], 81 y.o.   MRN: [id number] Complaint:  Visit Type: Patient returns to my office for continued preventative foot care services. Complaint: Patient states" my nails have grown long and thick and become painful to walk and wear shoes". The patient presents for preventative foot care services. No changes to ROS  Podiatric Exam: Vascular: dorsalis pedis and posterior tibial pulses are palpable bilateral. Capillary return is immediate. Temperature gradient is WNL. Skin turgor WNL  Sensorium: Diminished  Semmes Weinstein monofilament test. Normal tactile sensation bilaterally. Nail Exam: Pt has thick disfigured discolored nails with subungual debris noted bilateral entire nail hallux through fifth toenails Ulcer Exam: There is no evidence of ulcer or pre-ulcerative changes or infection. Orthopedic Exam: Muscle tone and strength are WNL. No limitations in general ROM. No crepitus or effusions noted. Foot type and digits show no abnormalities. Bony prominences are unremarkable. Skin: No Porokeratosis. No infection or ulcers  Diagnosis:  Onychomycosis, , Pain in right toe, pain in left toes  Treatment & Plan Procedures and Treatment: Consent by patient was obtained for treatment procedures. The patient understood the discussion of treatment and procedures well. All questions were answered thoroughly reviewed. Debridement of mycotic and hypertrophic toenails, 1 through 5 bilateral and clearing of subungual debris. No ulceration, no infection noted.  Return Visit-Office Procedure: Patient instructed to return to the office for a follow up visit 3 months for continued evaluation and treatment.   Helane GuntherGregory Tinamarie Przybylski DPM

## 2016-01-09 ENCOUNTER — Encounter: Payer: Self-pay | Admitting: Podiatry

## 2016-01-09 ENCOUNTER — Ambulatory Visit (INDEPENDENT_AMBULATORY_CARE_PROVIDER_SITE_OTHER): Payer: No Typology Code available for payment source | Admitting: Podiatry

## 2016-01-09 VITALS — Ht 70.0 in | Wt 249.0 lb

## 2016-01-09 DIAGNOSIS — B351 Tinea unguium: Secondary | ICD-10-CM

## 2016-01-09 DIAGNOSIS — M79676 Pain in unspecified toe(s): Secondary | ICD-10-CM | POA: Diagnosis not present

## 2016-01-09 NOTE — Progress Notes (Signed)
Patient ID: Brian Jennings, male   DOB: 01/08/1935, 81 y.o.   MRN: [id number] Complaint:  Visit Type: Patient returns to my office for continued preventative foot care services. Complaint: Patient states" my nails have grown long and thick and become painful to walk and wear shoes". The patient presents for preventative foot care services. No changes to ROS  Podiatric Exam: Vascular: dorsalis pedis and posterior tibial pulses are palpable bilateral. Capillary return is immediate. Temperature gradient is WNL. Skin turgor WNL  Sensorium: Diminished  Semmes Weinstein monofilament test. Normal tactile sensation bilaterally. Nail Exam: Pt has thick disfigured discolored nails with subungual debris noted bilateral entire nail hallux through fifth toenails Ulcer Exam: There is no evidence of ulcer or pre-ulcerative changes or infection. Orthopedic Exam: Muscle tone and strength are WNL. No limitations in general ROM. No crepitus or effusions noted. Foot type and digits show no abnormalities. Bony prominences are unremarkable. Skin: No Porokeratosis. No infection or ulcers  Diagnosis:  Onychomycosis, , Pain in right toe, pain in left toes  Treatment & Plan Procedures and Treatment: Consent by patient was obtained for treatment procedures. The patient understood the discussion of treatment and procedures well. All questions were answered thoroughly reviewed. Debridement of mycotic and hypertrophic toenails, 1 through 5 bilateral and clearing of subungual debris. No ulceration, no infection noted.  Return Visit-Office Procedure: Patient instructed to return to the office for a follow up visit 3 months for continued evaluation and treatment.   Dahlila Pfahler DPM 

## 2016-03-02 DIAGNOSIS — I10 Essential (primary) hypertension: Secondary | ICD-10-CM | POA: Diagnosis not present

## 2016-03-02 DIAGNOSIS — J208 Acute bronchitis due to other specified organisms: Secondary | ICD-10-CM | POA: Diagnosis not present

## 2016-04-02 ENCOUNTER — Ambulatory Visit: Payer: No Typology Code available for payment source | Admitting: Podiatry

## 2016-04-05 ENCOUNTER — Ambulatory Visit: Payer: No Typology Code available for payment source | Admitting: Sports Medicine

## 2016-04-11 ENCOUNTER — Ambulatory Visit: Payer: No Typology Code available for payment source | Admitting: Sports Medicine

## 2016-04-18 ENCOUNTER — Ambulatory Visit: Payer: No Typology Code available for payment source | Admitting: Sports Medicine

## 2016-06-27 ENCOUNTER — Encounter: Payer: Self-pay | Admitting: Sports Medicine

## 2016-06-27 ENCOUNTER — Ambulatory Visit (INDEPENDENT_AMBULATORY_CARE_PROVIDER_SITE_OTHER): Payer: No Typology Code available for payment source | Admitting: Sports Medicine

## 2016-06-27 DIAGNOSIS — M79676 Pain in unspecified toe(s): Secondary | ICD-10-CM

## 2016-06-27 DIAGNOSIS — B351 Tinea unguium: Secondary | ICD-10-CM | POA: Diagnosis not present

## 2016-06-27 NOTE — Progress Notes (Signed)
  Subjective: Brian Jennings is a 82 y.o. male patient seen today in office with complaint of painful thickened and elongated toenails; unable to trim. Patient denies history of known Diabetes, Neuropathy, or Vascular disease. Patient has no other pedal complaints at this time.   Patient Active Problem List   Diagnosis Date Noted  . Allergic rhinitis 10/30/2013  . Dyspnea 10/20/2013    Current Outpatient Prescriptions on File Prior to Visit  Medication Sig Dispense Refill  . aspirin 81 MG tablet Take 81 mg by mouth daily.    . cetirizine (ZYRTEC) 10 MG tablet Take 10 mg by mouth daily.    . Cholecalciferol 2000 UNITS CAPS Take 1 capsule by mouth daily.    . citalopram (CELEXA) 40 MG tablet Take 40 mg by mouth daily as needed.    . donepezil (ARICEPT) 10 MG tablet Take 10 mg by mouth at bedtime.    . famciclovir (FAMVIR) 500 MG tablet Take 500 mg by mouth 3 (three) times daily.    . fluticasone (FLONASE) 50 MCG/ACT nasal spray Place 2 sprays into both nostrils 2 (two) times daily. 16 g 11  . levothyroxine (SYNTHROID, LEVOTHROID) 50 MCG tablet Take 50 mcg by mouth daily before breakfast.    . lisinopril (PRINIVIL,ZESTRIL) 40 MG tablet Take 40 mg by mouth daily.    Marland Kitchen MENTHOL-METHYL SALICYLATE EX Apply topically 4 (four) times daily.    . mirabegron ER (MYRBETRIQ) 25 MG TB24 tablet Take 25 mg by mouth daily.    . potassium chloride (MICRO-K) 10 MEQ CR capsule Take 10 mEq by mouth 2 (two) times daily.    . pravastatin (PRAVACHOL) 40 MG tablet Take 40 mg by mouth daily.    . QUEtiapine (SEROQUEL) 25 MG tablet Take 25 mg by mouth as needed (insomnia).    . ranitidine (ZANTAC) 150 MG capsule Take 150 mg by mouth 2 (two) times daily.     No current facility-administered medications on file prior to visit.     No Known Allergies  Objective: Physical Exam  General: Well developed, nourished, no acute distress, awake, alert and oriented x 3  Vascular: Dorsalis pedis artery 1/4 bilateral,  Posterior tibial artery 1/4 bilateral, skin temperature warm to warm proximal to distal bilateral lower extremities, mild varicosities, pedal hair present bilateral.  Neurological: Gross sensation present via light touch bilateral.   Dermatological: Skin is warm, dry, and supple bilateral, Nails 1-10 are tender, long, thick, and discolored with mild subungal debris, no webspace macerations present bilateral, no open lesions present bilateral, no callus/corns/hyperkeratotic tissue present bilateral. No signs of infection bilateral.  Musculoskeletal: No symptomatic boney deformities noted bilateral. Muscular strength within normal limits without painon range of motion. No pain with calf compression bilateral.  Assessment and Plan:  Problem List Items Addressed This Visit    None    Visit Diagnoses    Pain due to onychomycosis of toenail    -  Primary   Pain of toe, unspecified laterality          -Examined patient.  -Discussed treatment options for painful mycotic nails. -Mechanically debrided and reduced mycotic nails with sterile nail nipper and dremel nail file without incident. -Patient to return in 3 months for follow up evaluation or sooner if symptoms worsen.  Asencion Islam, DPM

## 2016-09-27 ENCOUNTER — Ambulatory Visit (INDEPENDENT_AMBULATORY_CARE_PROVIDER_SITE_OTHER): Payer: No Typology Code available for payment source | Admitting: Sports Medicine

## 2016-09-27 DIAGNOSIS — M79676 Pain in unspecified toe(s): Secondary | ICD-10-CM | POA: Diagnosis not present

## 2016-09-27 DIAGNOSIS — B351 Tinea unguium: Secondary | ICD-10-CM

## 2016-09-27 NOTE — Progress Notes (Signed)
  Subjective: Brian Jennings is a 82 y.o. male patient seen today in office with complaint of painful thickened and elongated toenails; unable to trim. Patient denies any changes with medical history. Patient has no other pedal complaints at this time.   Patient Active Problem List   Diagnosis Date Noted  . Allergic rhinitis 10/30/2013  . Dyspnea 10/20/2013    Current Outpatient Prescriptions on File Prior to Visit  Medication Sig Dispense Refill  . aspirin 81 MG tablet Take 81 mg by mouth daily.    . cetirizine (ZYRTEC) 10 MG tablet Take 10 mg by mouth daily.    . Cholecalciferol 2000 UNITS CAPS Take 1 capsule by mouth daily.    . citalopram (CELEXA) 40 MG tablet Take 40 mg by mouth daily as needed.    . donepezil (ARICEPT) 10 MG tablet Take 10 mg by mouth at bedtime.    . famciclovir (FAMVIR) 500 MG tablet Take 500 mg by mouth 3 (three) times daily.    . fluticasone (FLONASE) 50 MCG/ACT nasal spray Place 2 sprays into both nostrils 2 (two) times daily. 16 g 11  . levothyroxine (SYNTHROID, LEVOTHROID) 50 MCG tablet Take 50 mcg by mouth daily before breakfast.    . lisinopril (PRINIVIL,ZESTRIL) 40 MG tablet Take 40 mg by mouth daily.    Marland Kitchen. MENTHOL-METHYL SALICYLATE EX Apply topically 4 (four) times daily.    . mirabegron ER (MYRBETRIQ) 25 MG TB24 tablet Take 25 mg by mouth daily.    . potassium chloride (MICRO-K) 10 MEQ CR capsule Take 10 mEq by mouth 2 (two) times daily.    . pravastatin (PRAVACHOL) 40 MG tablet Take 40 mg by mouth daily.    . QUEtiapine (SEROQUEL) 25 MG tablet Take 25 mg by mouth as needed (insomnia).    . ranitidine (ZANTAC) 150 MG capsule Take 150 mg by mouth 2 (two) times daily.     No current facility-administered medications on file prior to visit.     No Known Allergies  Objective: Physical Exam  General: Well developed, nourished, no acute distress, awake, alert and oriented x 3  Vascular: Dorsalis pedis artery 1/4 bilateral, Posterior tibial artery 1/4  bilateral, skin temperature warm to warm proximal to distal bilateral lower extremities, mild varicosities, pedal hair present bilateral.  Neurological: Gross sensation present via light touch bilateral.   Dermatological: Skin is warm, dry, and supple bilateral, Nails 1-10 are tender, long, thick, and discolored with mild subungal debris, no webspace macerations present bilateral, no open lesions present bilateral, no callus/corns/hyperkeratotic tissue present bilateral. No signs of infection bilateral.  Musculoskeletal: No symptomatic boney deformities noted bilateral. Muscular strength within normal limits without painon range of motion. No pain with calf compression bilateral.  Assessment and Plan:  Problem List Items Addressed This Visit    None    Visit Diagnoses    Pain due to onychomycosis of toenail    -  Primary     -Examined patient.  -Discussed treatment options for painful mycotic nails. -Mechanically debrided and reduced mycotic nails with sterile nail nipper and dremel nail file without incident. -Patient to return in 3 months for follow up evaluation or sooner if symptoms worsen.  Asencion Islamitorya Jalene Lacko, DPM

## 2016-11-30 ENCOUNTER — Ambulatory Visit (INDEPENDENT_AMBULATORY_CARE_PROVIDER_SITE_OTHER): Payer: Medicare HMO | Admitting: Sports Medicine

## 2016-11-30 DIAGNOSIS — B351 Tinea unguium: Secondary | ICD-10-CM | POA: Diagnosis not present

## 2016-11-30 DIAGNOSIS — R69 Illness, unspecified: Secondary | ICD-10-CM | POA: Diagnosis not present

## 2016-11-30 DIAGNOSIS — M79676 Pain in unspecified toe(s): Secondary | ICD-10-CM

## 2016-11-30 NOTE — Progress Notes (Signed)
  Subjective: Brian Jennings is a 82 y.o. male patient seen today in office with complaint of painful thickened and elongated toenails; unable to trim. Patient denies any changes with medical history. Patient has no other pedal complaints at this time.   Patient Active Problem List   Diagnosis Date Noted  . Allergic rhinitis 10/30/2013  . Dyspnea 10/20/2013    Current Outpatient Prescriptions on File Prior to Visit  Medication Sig Dispense Refill  . aspirin 81 MG tablet Take 81 mg by mouth daily.    . cetirizine (ZYRTEC) 10 MG tablet Take 10 mg by mouth daily.    . Cholecalciferol 2000 UNITS CAPS Take 1 capsule by mouth daily.    . citalopram (CELEXA) 40 MG tablet Take 40 mg by mouth daily as needed.    . donepezil (ARICEPT) 10 MG tablet Take 10 mg by mouth at bedtime.    . famciclovir (FAMVIR) 500 MG tablet Take 500 mg by mouth 3 (three) times daily.    . fluticasone (FLONASE) 50 MCG/ACT nasal spray Place 2 sprays into both nostrils 2 (two) times daily. 16 g 11  . levothyroxine (SYNTHROID, LEVOTHROID) 50 MCG tablet Take 50 mcg by mouth daily before breakfast.    . lisinopril (PRINIVIL,ZESTRIL) 40 MG tablet Take 40 mg by mouth daily.    . MENTHOL-METHYL SALICYLATE EX Apply topically 4 (four) times daily.    . mirabegron ER (MYRBETRIQ) 25 MG TB24 tablet Take 25 mg by mouth daily.    . potassium chloride (MICRO-K) 10 MEQ CR capsule Take 10 mEq by mouth 2 (two) times daily.    . pravastatin (PRAVACHOL) 40 MG tablet Take 40 mg by mouth daily.    . QUEtiapine (SEROQUEL) 25 MG tablet Take 25 mg by mouth as needed (insomnia).    . ranitidine (ZANTAC) 150 MG capsule Take 150 mg by mouth 2 (two) times daily.     No current facility-administered medications on file prior to visit.     No Known Allergies  Objective: Physical Exam  General: Well developed, nourished, no acute distress, awake, alert and oriented x 3  Vascular: Dorsalis pedis artery 1/4 bilateral, Posterior tibial artery 1/4  bilateral, skin temperature warm to warm proximal to distal bilateral lower extremities, mild varicosities, pedal hair present bilateral.  Neurological: Gross sensation present via light touch bilateral.   Dermatological: Skin is warm, dry, and supple bilateral, Nails 1-10 are tender, long, thick, and discolored with mild subungal debris, no webspace macerations present bilateral, no open lesions present bilateral, no callus/corns/hyperkeratotic tissue present bilateral. No signs of infection bilateral.  Musculoskeletal: No symptomatic boney deformities noted bilateral. Muscular strength within normal limits without painon range of motion. No pain with calf compression bilateral.  Assessment and Plan:  Problem List Items Addressed This Visit    None    Visit Diagnoses    Pain due to onychomycosis of toenail    -  Primary     -Examined patient.  -Discussed treatment options for painful mycotic nails. -Mechanically debrided and reduced mycotic nails with sterile nail nipper and dremel nail file without incident. -Patient to return in 3 months for follow up evaluation or sooner if symptoms worsen.  Kenzi Bardwell, DPM  

## 2016-12-28 ENCOUNTER — Ambulatory Visit: Payer: No Typology Code available for payment source | Admitting: Sports Medicine

## 2017-03-01 ENCOUNTER — Ambulatory Visit (INDEPENDENT_AMBULATORY_CARE_PROVIDER_SITE_OTHER): Payer: Medicare HMO | Admitting: Sports Medicine

## 2017-03-01 ENCOUNTER — Encounter: Payer: Self-pay | Admitting: Sports Medicine

## 2017-03-01 DIAGNOSIS — M79676 Pain in unspecified toe(s): Secondary | ICD-10-CM

## 2017-03-01 DIAGNOSIS — B351 Tinea unguium: Secondary | ICD-10-CM

## 2017-03-01 NOTE — Progress Notes (Signed)
  Subjective: Brian Jennings is a 83 y.o. mChipper Omanale patient seen today in office with complaint of painful thickened and elongated toenails; unable to trim. Patient denies any changes with medical history. Patient has no other pedal complaints at this time.   Patient Active Problem List   Diagnosis Date Noted  . Allergic rhinitis 10/30/2013  . Dyspnea 10/20/2013    Current Outpatient Medications on File Prior to Visit  Medication Sig Dispense Refill  . aspirin 81 MG tablet Take 81 mg by mouth daily.    . cetirizine (ZYRTEC) 10 MG tablet Take 10 mg by mouth daily.    . Cholecalciferol 2000 UNITS CAPS Take 1 capsule by mouth daily.    . citalopram (CELEXA) 40 MG tablet Take 40 mg by mouth daily as needed.    . donepezil (ARICEPT) 10 MG tablet Take 10 mg by mouth at bedtime.    . famciclovir (FAMVIR) 500 MG tablet Take 500 mg by mouth 3 (three) times daily.    . fluticasone (FLONASE) 50 MCG/ACT nasal spray Place 2 sprays into both nostrils 2 (two) times daily. 16 g 11  . levothyroxine (SYNTHROID, LEVOTHROID) 50 MCG tablet Take 50 mcg by mouth daily before breakfast.    . lisinopril (PRINIVIL,ZESTRIL) 40 MG tablet Take 40 mg by mouth daily.    Marland Kitchen. MENTHOL-METHYL SALICYLATE EX Apply topically 4 (four) times daily.    . mirabegron ER (MYRBETRIQ) 25 MG TB24 tablet Take 25 mg by mouth daily.    . potassium chloride (MICRO-K) 10 MEQ CR capsule Take 10 mEq by mouth 2 (two) times daily.    . pravastatin (PRAVACHOL) 40 MG tablet Take 40 mg by mouth daily.    . QUEtiapine (SEROQUEL) 25 MG tablet Take 25 mg by mouth as needed (insomnia).    . ranitidine (ZANTAC) 150 MG capsule Take 150 mg by mouth 2 (two) times daily.     No current facility-administered medications on file prior to visit.     No Known Allergies  Objective: Physical Exam  General: Well developed, nourished, no acute distress, awake, alert and oriented x 3  Vascular: Dorsalis pedis artery 1/4 bilateral, Posterior tibial artery 1/4  bilateral, skin temperature warm to warm proximal to distal bilateral lower extremities, mild varicosities, pedal hair present bilateral.  Neurological: Gross sensation present via light touch bilateral.   Dermatological: Skin is warm, dry, and supple bilateral, Nails 1-10 are tender, long, thick, and discolored with mild subungal debris, no webspace macerations present bilateral, no open lesions present bilateral, no callus/corns/hyperkeratotic tissue present bilateral. No signs of infection bilateral.  Musculoskeletal: No symptomatic boney deformities noted bilateral. Muscular strength within normal limits without painon range of motion. No pain with calf compression bilateral.  Assessment and Plan:  Problem List Items Addressed This Visit    None    Visit Diagnoses    Pain due to onychomycosis of toenail    -  Primary     -Examined patient.  -Discussed treatment options for painful mycotic nails. -Mechanically debrided and reduced mycotic nails with sterile nail nipper and dremel nail file without incident. -Patient to return in 3 months for follow up evaluation or sooner if symptoms worsen.  [name], DPM

## 2017-05-24 ENCOUNTER — Ambulatory Visit: Payer: Medicare HMO | Admitting: Sports Medicine

## 2017-05-30 ENCOUNTER — Ambulatory Visit: Payer: Medicare HMO | Admitting: Sports Medicine

## 2017-06-12 ENCOUNTER — Ambulatory Visit: Payer: Medicare HMO | Admitting: Sports Medicine

## 2017-06-21 ENCOUNTER — Encounter: Payer: Self-pay | Admitting: Sports Medicine

## 2017-06-21 ENCOUNTER — Ambulatory Visit (INDEPENDENT_AMBULATORY_CARE_PROVIDER_SITE_OTHER): Payer: Medicare HMO | Admitting: Sports Medicine

## 2017-06-21 DIAGNOSIS — M79676 Pain in unspecified toe(s): Secondary | ICD-10-CM

## 2017-06-21 DIAGNOSIS — B351 Tinea unguium: Secondary | ICD-10-CM

## 2017-06-21 NOTE — Progress Notes (Signed)
  Subjective: Brian Jennings is a 83 y.o. male patient seen today in office with complaint of painful thickened and elongated toenails; unable to trim. Patient denies any changes with medical history. Patient has no other pedal complaints at this time.   [facility].   Patient Active Problem List   Diagnosis Date Noted  . Allergic rhinitis 10/30/2013  . Dyspnea 10/20/2013    Current Outpatient Medications on File Prior to Visit  Medication Sig Dispense Refill  . aspirin 81 MG tablet Take 81 mg by mouth daily.    . cetirizine (ZYRTEC) 10 MG tablet Take 10 mg by mouth daily.    . Cholecalciferol 2000 UNITS CAPS Take 1 capsule by mouth daily.    . citalopram (CELEXA) 40 MG tablet Take 40 mg by mouth daily as needed.    . donepezil (ARICEPT) 10 MG tablet Take 10 mg by mouth at bedtime.    . famciclovir (FAMVIR) 500 MG tablet Take 500 mg by mouth 3 (three) times daily.    . fluticasone (FLONASE) 50 MCG/ACT nasal spray Place 2 sprays into both nostrils 2 (two) times daily. 16 g 11  . levothyroxine (SYNTHROID, LEVOTHROID) 50 MCG tablet Take 50 mcg by mouth daily before breakfast.    . lisinopril (PRINIVIL,ZESTRIL) 40 MG tablet Take 40 mg by mouth daily.    Marland Kitchen. MENTHOL-METHYL SALICYLATE EX Apply topically 4 (four) times daily.    . mirabegron ER (MYRBETRIQ) 25 MG TB24 tablet Take 25 mg by mouth daily.    . potassium chloride (MICRO-K) 10 MEQ CR capsule Take 10 mEq by mouth 2 (two) times daily.    . pravastatin (PRAVACHOL) 40 MG tablet Take 40 mg by mouth daily.    . QUEtiapine (SEROQUEL) 25 MG tablet Take 25 mg by mouth as needed (insomnia).    . ranitidine (ZANTAC) 150 MG capsule Take 150 mg by mouth 2 (two) times daily.     No current facility-administered medications on file prior to visit.     No Known Allergies  Objective: Physical Exam  General: Well developed, nourished, no acute distress, awake, alert and oriented x 3  Vascular:  Dorsalis pedis artery 1/4 bilateral, Posterior tibial artery 1/4 bilateral, skin temperature warm to warm proximal to distal bilateral lower extremities, mild varicosities, pedal hair present bilateral.  Neurological: Gross sensation present via light touch bilateral.   Dermatological: Skin is warm, dry, and supple bilateral, Nails 1-10 are tender, long, thick, and discolored with mild subungal debris, no webspace macerations present bilateral, no open lesions present bilateral, no callus/corns/hyperkeratotic tissue present bilateral. No signs of infection bilateral.  Musculoskeletal: No symptomatic boney deformities noted bilateral. Muscular strength within normal limits without painon range of motion. No pain with calf compression bilateral.  Assessment and Plan:  Problem List Items Addressed This Visit    None    Visit Diagnoses    Pain due to onychomycosis of toenail    -  Primary     -Examined patient.  -Discussed treatment options for painful mycotic nails. -Mechanically debrided and reduced mycotic nails with sterile nail nipper and dremel nail file without incident. -Patient to return in 3 months for follow up evaluation or sooner if symptoms worsen.  Asencion Islamitorya Temeca Somma, DPM

## 2017-06-26 ENCOUNTER — Ambulatory Visit: Payer: Medicare HMO | Admitting: Sports Medicine

## 2017-09-20 ENCOUNTER — Ambulatory Visit (INDEPENDENT_AMBULATORY_CARE_PROVIDER_SITE_OTHER): Payer: Medicare HMO | Admitting: Sports Medicine

## 2017-09-20 ENCOUNTER — Encounter: Payer: Self-pay | Admitting: Sports Medicine

## 2017-09-20 VITALS — BP 153/82 | HR 53 | Temp 98.0°F | Resp 16

## 2017-09-20 DIAGNOSIS — M79676 Pain in unspecified toe(s): Secondary | ICD-10-CM | POA: Diagnosis not present

## 2017-09-20 DIAGNOSIS — B351 Tinea unguium: Secondary | ICD-10-CM | POA: Diagnosis not present

## 2017-09-20 NOTE — Progress Notes (Signed)
  Subjective: Brian Jennings is a 83 y.o. male patient seen today in office with complaint of painful thickened and elongated toenails; unable to trim. Patient denies any changes with medical history. Patient has no other pedal complaints at this time.   [facility] as previously noted.  Patient Active Problem List   Diagnosis Date Noted  . Allergic rhinitis 10/30/2013  . Dyspnea 10/20/2013    Current Outpatient Medications on File Prior to Visit  Medication Sig Dispense Refill  . aspirin 81 MG tablet Take 81 mg by mouth daily.    . cetirizine (ZYRTEC) 10 MG tablet Take 10 mg by mouth daily.    . Cholecalciferol 2000 UNITS CAPS Take 1 capsule by mouth daily.    . citalopram (CELEXA) 40 MG tablet Take 40 mg by mouth daily as needed.    . donepezil (ARICEPT) 10 MG tablet Take 10 mg by mouth at bedtime.    . famciclovir (FAMVIR) 500 MG tablet Take 500 mg by mouth 3 (three) times daily.    . fluticasone (FLONASE) 50 MCG/ACT nasal spray Place 2 sprays into both nostrils 2 (two) times daily. 16 g 11  . levothyroxine (SYNTHROID, LEVOTHROID) 50 MCG tablet Take 50 mcg by mouth daily before breakfast.    . lisinopril (PRINIVIL,ZESTRIL) 40 MG tablet Take 40 mg by mouth daily.    Marland Kitchen. MENTHOL-METHYL SALICYLATE EX Apply topically 4 (four) times daily.    . mirabegron ER (MYRBETRIQ) 25 MG TB24 tablet Take 25 mg by mouth daily.    . potassium chloride (MICRO-K) 10 MEQ CR capsule Take 10 mEq by mouth 2 (two) times daily.    . pravastatin (PRAVACHOL) 40 MG tablet Take 40 mg by mouth daily.    . QUEtiapine (SEROQUEL) 25 MG tablet Take 25 mg by mouth as needed (insomnia).    . ranitidine (ZANTAC) 150 MG capsule Take 150 mg by mouth 2 (two) times daily.     No current facility-administered medications on file prior to visit.     No Known Allergies  Objective: Physical Exam  General: Well developed, nourished, no acute distress, awake, alert and oriented x  3  Vascular: Dorsalis pedis artery 1/4 bilateral, Posterior tibial artery 1/4 bilateral, skin temperature warm to warm proximal to distal bilateral lower extremities, mild varicosities, pedal hair present bilateral.  Neurological: Gross sensation present via light touch bilateral.   Dermatological: Skin is warm, dry, and supple bilateral, Nails 1-10 are tender, long, thick, and discolored with mild subungal debris, no webspace macerations present bilateral, no open lesions present bilateral, no callus/corns/hyperkeratotic tissue present bilateral.  Scrapes present on anterior shins bilateral from his dog jumping on him.  No signs of infection bilateral.  Musculoskeletal: Asymptomatic pes planus boney deformities noted bilateral. Muscular strength within normal limits without painon range of motion. No pain with calf compression bilateral.  Assessment and Plan:  Problem List Items Addressed This Visit    None    Visit Diagnoses    Pain due to onychomycosis of toenail    -  Primary     -Examined patient.  -Discussed treatment options for painful mycotic nails. -Mechanically debrided and reduced mycotic nails with sterile nail nipper and dremel nail file without incident. -Patient to return in 3 months for follow up evaluation or sooner if symptoms worsen.  Asencion Islamitorya Makynzi Eastland, DPM

## 2017-11-20 DIAGNOSIS — R69 Illness, unspecified: Secondary | ICD-10-CM | POA: Diagnosis not present

## 2017-12-09 DIAGNOSIS — M5412 Radiculopathy, cervical region: Secondary | ICD-10-CM | POA: Insufficient documentation

## 2017-12-11 DIAGNOSIS — R69 Illness, unspecified: Secondary | ICD-10-CM | POA: Diagnosis not present

## 2017-12-12 ENCOUNTER — Ambulatory Visit (INDEPENDENT_AMBULATORY_CARE_PROVIDER_SITE_OTHER): Payer: Non-veteran care | Admitting: Sports Medicine

## 2017-12-12 ENCOUNTER — Encounter: Payer: Self-pay | Admitting: Sports Medicine

## 2017-12-12 VITALS — BP 185/96 | HR 61 | Resp 16

## 2017-12-12 DIAGNOSIS — B351 Tinea unguium: Secondary | ICD-10-CM | POA: Diagnosis not present

## 2017-12-12 DIAGNOSIS — M79676 Pain in unspecified toe(s): Secondary | ICD-10-CM | POA: Diagnosis not present

## 2017-12-12 NOTE — Progress Notes (Signed)
Subjective: Shashank Kwasnik is a 83 y.o. male patient seen today in office with complaint of painful thickened and elongated toenails; unable to trim. Patient denies any changes with medical history. Patient has no other pedal complaints at this time.   Patient goes to [facility] for all other medical care as previously noted. No other issues.   Patient Active Problem List   Diagnosis Date Noted  . Allergic rhinitis 10/30/2013  . Dyspnea 10/20/2013    Current Outpatient Medications on File Prior to Visit  Medication Sig Dispense Refill  . acetaminophen (TYLENOL) 500 MG tablet Take by mouth.    Marland Kitchen acyclovir (ZOVIRAX) 800 MG tablet Take by mouth.    Marland Kitchen aspirin 81 MG tablet Take 81 mg by mouth daily.    . carvedilol (COREG) 25 MG tablet Take by mouth.    . cetirizine (ZYRTEC) 10 MG tablet Take 10 mg by mouth daily.    . Cholecalciferol 2000 UNITS CAPS Take 1 capsule by mouth daily.    . ciclopirox (PENLAC) 8 % solution     . citalopram (CELEXA) 40 MG tablet Take 40 mg by mouth daily as needed.    . diclofenac sodium (VOLTAREN) 1 % GEL Apply topically.    . diphenhydrAMINE (BENADRYL) 50 MG capsule Take by mouth.    . donepezil (ARICEPT) 10 MG tablet Take 10 mg by mouth at bedtime.    Marland Kitchen doxazosin (CARDURA) 8 MG tablet Take by mouth.    . famciclovir (FAMVIR) 500 MG tablet Take 500 mg by mouth 3 (three) times daily.    . finasteride (PROSCAR) 5 MG tablet Take by mouth.    . fluticasone (FLONASE) 50 MCG/ACT nasal spray Place 2 sprays into both nostrils 2 (two) times daily. 16 g 11  . gabapentin (NEURONTIN) 300 MG capsule     . hydrALAZINE (APRESOLINE) 100 MG tablet Take by mouth.    . isosorbide mononitrate (IMDUR) 30 MG 24 hr tablet Take by mouth.    . levothyroxine (SYNTHROID, LEVOTHROID) 50 MCG tablet Take 50 mcg by mouth daily before breakfast.    . lidocaine (LIDODERM) 5 % 2 times a day    . lisinopril (PRINIVIL,ZESTRIL) 40 MG tablet Take 40 mg by mouth daily.    Marland Kitchen LORazepam  (ATIVAN) 0.5 MG tablet Take by mouth.    . MENTHOL-METHYL SALICYLATE EX Apply topically 4 (four) times daily.    . methocarbamol (ROBAXIN) 750 MG tablet 4 times a day    . mirabegron ER (MYRBETRIQ) 25 MG TB24 tablet Take 25 mg by mouth daily.    Marland Kitchen morphine (MSIR) 15 MG tablet 2 times a day as needed    . potassium chloride (MICRO-K) 10 MEQ CR capsule Take 10 mEq by mouth 2 (two) times daily.    . pravastatin (PRAVACHOL) 40 MG tablet Take 40 mg by mouth daily.    . pregabalin (LYRICA) 50 MG capsule 3 times a day    . QUEtiapine (SEROQUEL) 25 MG tablet Take 25 mg by mouth as needed (insomnia).    . ranitidine (ZANTAC) 150 MG capsule Take 150 mg by mouth 2 (two) times daily.    . TraMADol HCl 100 MG TB24 3 times a day    . triamcinolone cream (KENALOG) 0.1 % Apply topically.     No current facility-administered medications on file prior to visit.     No Known Allergies  Objective: Physical Exam  General: Well developed, nourished, no acute distress, awake, alert and oriented x 3  Vascular: Dorsalis pedis artery 1/4 bilateral, Posterior tibial artery 1/4 bilateral, skin temperature warm to warm proximal to distal bilateral lower extremities, mild varicosities, pedal hair present bilateral.  Neurological: Gross sensation present via light touch bilateral.   Dermatological: Skin is warm, dry, and supple bilateral, Nails 1-10 are tender, long, thick, and discolored with mild subungal debris, no webspace macerations present bilateral, no open lesions present bilateral, no callus/corns/hyperkeratotic tissue present bilateral.  Scrapes present on anterior shins bilateral from his dog jumping on him.  No signs of infection bilateral.  Musculoskeletal: Asymptomatic pes planus boney deformities noted bilateral. Muscular strength within normal limits without painon range of motion. No pain with calf compression bilateral.  Assessment and Plan:  Problem List Items Addressed This Visit    None     Visit Diagnoses    Pain due to onychomycosis of toenail    -  Primary   Relevant Medications   acyclovir (ZOVIRAX) 800 MG tablet   ciclopirox (PENLAC) 8 % solution     -Examined patient.  -Discussed treatment options for painful mycotic nails. -Mechanically debrided and reduced mycotic nails with sterile nail nipper and dremel nail file without incident. -Patient to return in 3 months for follow up evaluation or sooner if symptoms worsen.  Asencion Islam, DPM

## 2017-12-20 ENCOUNTER — Ambulatory Visit: Payer: Medicare HMO | Admitting: Sports Medicine

## 2017-12-26 ENCOUNTER — Ambulatory Visit: Payer: Medicare HMO | Admitting: Sports Medicine

## 2018-03-11 DIAGNOSIS — M461 Sacroiliitis, not elsewhere classified: Secondary | ICD-10-CM | POA: Insufficient documentation

## 2018-03-13 ENCOUNTER — Ambulatory Visit (INDEPENDENT_AMBULATORY_CARE_PROVIDER_SITE_OTHER): Payer: No Typology Code available for payment source | Admitting: Sports Medicine

## 2018-03-13 ENCOUNTER — Encounter: Payer: Self-pay | Admitting: Sports Medicine

## 2018-03-13 VITALS — BP 140/70 | HR 77 | Resp 16

## 2018-03-13 DIAGNOSIS — M79676 Pain in unspecified toe(s): Secondary | ICD-10-CM

## 2018-03-13 DIAGNOSIS — B351 Tinea unguium: Secondary | ICD-10-CM | POA: Diagnosis not present

## 2018-03-13 NOTE — Progress Notes (Signed)
Subjective: Brian Jennings is a 84 y.o. male patient seen today in office with complaint of painful thickened and elongated toenails; unable to trim. Patient denies any changes with medical history. Patient has no other pedal complaints at this time.   [facility] as previously noted. No other issues noted at this time.   Patient Active Problem List   Diagnosis Date Noted  . Allergic rhinitis 10/30/2013  . Dyspnea 10/20/2013    Current Outpatient Medications on File Prior to Visit  Medication Sig Dispense Refill  . acetaminophen (TYLENOL) 500 MG tablet Take by mouth.    Marland Kitchen. acyclovir (ZOVIRAX) 800 MG tablet Take by mouth.    Marland Kitchen. aspirin 81 MG tablet Take 81 mg by mouth daily.    . carvedilol (COREG) 25 MG tablet Take by mouth.    . cetirizine (ZYRTEC) 10 MG tablet Take 10 mg by mouth daily.    . Cholecalciferol 2000 UNITS CAPS Take 1 capsule by mouth daily.    . ciclopirox (PENLAC) 8 % solution     . citalopram (CELEXA) 40 MG tablet Take 40 mg by mouth daily as needed.    . diclofenac sodium (VOLTAREN) 1 % GEL Apply topically.    . diphenhydrAMINE (BENADRYL) 50 MG capsule Take by mouth.    . donepezil (ARICEPT) 10 MG tablet Take 10 mg by mouth at bedtime.    Marland Kitchen. doxazosin (CARDURA) 8 MG tablet Take by mouth.    . famciclovir (FAMVIR) 500 MG tablet Take 500 mg by mouth 3 (three) times daily.    . finasteride (PROSCAR) 5 MG tablet Take by mouth.    . fluticasone (FLONASE) 50 MCG/ACT nasal spray Place 2 sprays into both nostrils 2 (two) times daily. 16 g 11  . gabapentin (NEURONTIN) 300 MG capsule     . hydrALAZINE (APRESOLINE) 100 MG tablet Take by mouth.    . isosorbide mononitrate (IMDUR) 30 MG 24 hr tablet Take by mouth.    . levothyroxine (SYNTHROID, LEVOTHROID) 50 MCG tablet Take 50 mcg by mouth daily before breakfast.    . lidocaine (LIDODERM) 5 % 2 times a day    . lisinopril (PRINIVIL,ZESTRIL) 40 MG tablet Take 40 mg by mouth daily.     Marland Kitchen. LORazepam (ATIVAN) 0.5 MG tablet Take by mouth.    . MENTHOL-METHYL SALICYLATE EX Apply topically 4 (four) times daily.    . methocarbamol (ROBAXIN) 750 MG tablet 4 times a day    . mirabegron ER (MYRBETRIQ) 25 MG TB24 tablet Take 25 mg by mouth daily.    Marland Kitchen. morphine (MSIR) 15 MG tablet 2 times a day as needed    . potassium chloride (MICRO-K) 10 MEQ CR capsule Take 10 mEq by mouth 2 (two) times daily.    . pravastatin (PRAVACHOL) 40 MG tablet Take 40 mg by mouth daily.    . pregabalin (LYRICA) 50 MG capsule 3 times a day    . QUEtiapine (SEROQUEL) 25 MG tablet Take 25 mg by mouth as needed (insomnia).    . ranitidine (ZANTAC) 150 MG capsule Take 150 mg by mouth 2 (two) times daily.    . TraMADol HCl 100 MG TB24 3 times a day    . triamcinolone cream (KENALOG) 0.1 % Apply topically.     No current facility-administered medications on file prior to visit.     No Known Allergies  Objective: Physical Exam  General: Well developed, nourished, no acute distress, awake, alert and  oriented x 3  Vascular: Dorsalis pedis artery 1/4 bilateral, Posterior tibial artery 1/4 bilateral, skin temperature warm to warm proximal to distal bilateral lower extremities, mild varicosities, pedal hair present bilateral.  Neurological: Gross sensation present via light touch bilateral.   Dermatological: Skin is warm, dry, and supple bilateral, Nails 1-10 are tender, long, thick, and discolored with mild subungal debris, no webspace macerations present bilateral, no open lesions present bilateral, no callus/corns/hyperkeratotic tissue present bilateral.  No signs of infection.  Musculoskeletal: Asymptomatic pes planus boney deformities noted bilateral. Muscular strength within normal limits without painon range of motion. No pain with calf compression bilateral.  Assessment and Plan:  Problem List Items Addressed This Visit    None    Visit Diagnoses    Pain due to onychomycosis of toenail    -  Primary      -Examined patient.  -Discussed treatment options for painful mycotic nails. -Mechanically debrided and reduced mycotic nails with sterile nail nipper and dremel nail file. Applied lumicane to nicks at left 1st toe and right 4th toe -Advised patient to monitor nicks if worsens return to office or go to ER -Patient to return in 3 months for follow up evaluation or sooner if symptoms worsen.  Asencion Islamitorya Robynne Roat, DPM

## 2018-03-18 DIAGNOSIS — R69 Illness, unspecified: Secondary | ICD-10-CM | POA: Diagnosis not present

## 2018-04-18 DIAGNOSIS — R69 Illness, unspecified: Secondary | ICD-10-CM | POA: Diagnosis not present

## 2018-06-05 DIAGNOSIS — M5136 Other intervertebral disc degeneration, lumbar region: Secondary | ICD-10-CM | POA: Insufficient documentation

## 2018-06-11 ENCOUNTER — Ambulatory Visit: Payer: Non-veteran care | Admitting: Sports Medicine

## 2018-06-12 ENCOUNTER — Ambulatory Visit: Payer: Non-veteran care | Admitting: Sports Medicine

## 2018-06-19 ENCOUNTER — Ambulatory Visit (INDEPENDENT_AMBULATORY_CARE_PROVIDER_SITE_OTHER): Payer: Non-veteran care | Admitting: Sports Medicine

## 2018-06-19 ENCOUNTER — Encounter: Payer: Self-pay | Admitting: Sports Medicine

## 2018-06-19 ENCOUNTER — Ambulatory Visit: Payer: Non-veteran care | Admitting: Sports Medicine

## 2018-06-19 ENCOUNTER — Other Ambulatory Visit: Payer: Self-pay

## 2018-06-19 VITALS — BP 164/91 | HR 61 | Temp 96.8°F | Resp 16

## 2018-06-19 DIAGNOSIS — B351 Tinea unguium: Secondary | ICD-10-CM | POA: Diagnosis not present

## 2018-06-19 DIAGNOSIS — M79676 Pain in unspecified toe(s): Secondary | ICD-10-CM | POA: Diagnosis not present

## 2018-06-19 NOTE — Progress Notes (Signed)
Subjective: Brian Jennings is a 84 y.o. male patient seen today in office with complaint of painful thickened and elongated toenails; unable to trim. Patient denies any changes with medical history. Patient has no other pedal complaints at this time.   Patient goes to [facility] for all other medical care as previously noted and reports it took him some time to get [place] auth for this visit. No other issues noted at this time.   Patient Active Problem List   Diagnosis Date Noted  . Sacroiliac inflammation (HCC) 03/11/2018  . Cervical radiculopathy 12/09/2017  . Allergic rhinitis 10/30/2013  . Dyspnea 10/20/2013    Current Outpatient Medications on File Prior to Visit  Medication Sig Dispense Refill  . acetaminophen (TYLENOL) 500 MG tablet Take by mouth.    Marland Kitchen acyclovir (ZOVIRAX) 800 MG tablet Take by mouth.    Marland Kitchen aspirin 81 MG tablet Take 81 mg by mouth daily.    . carvedilol (COREG) 25 MG tablet Take by mouth.    . cetirizine (ZYRTEC) 10 MG tablet Take 10 mg by mouth daily.    . Cholecalciferol 2000 UNITS CAPS Take 1 capsule by mouth daily.    . ciclopirox (PENLAC) 8 % solution     . citalopram (CELEXA) 40 MG tablet Take 40 mg by mouth daily as needed.    . diclofenac sodium (VOLTAREN) 1 % GEL Apply topically.    . diphenhydrAMINE (BENADRYL) 50 MG capsule Take by mouth.    . donepezil (ARICEPT) 10 MG tablet Take 10 mg by mouth at bedtime.    Marland Kitchen doxazosin (CARDURA) 8 MG tablet Take by mouth.    . famciclovir (FAMVIR) 500 MG tablet Take 500 mg by mouth 3 (three) times daily.    . finasteride (PROSCAR) 5 MG tablet Take by mouth.    . fluticasone (FLONASE) 50 MCG/ACT nasal spray Place 2 sprays into both nostrils 2 (two) times daily. 16 g 11  . gabapentin (NEURONTIN) 300 MG capsule     . hydrALAZINE (APRESOLINE) 100 MG tablet Take by mouth.    . isosorbide mononitrate (IMDUR) 30 MG 24 hr tablet Take by mouth.    . levothyroxine (SYNTHROID, LEVOTHROID) 50 MCG tablet Take 50 mcg by mouth  daily before breakfast.    . lidocaine (LIDODERM) 5 % 2 times a day    . lisinopril (PRINIVIL,ZESTRIL) 40 MG tablet Take 40 mg by mouth daily.    Marland Kitchen LORazepam (ATIVAN) 0.5 MG tablet Take by mouth.    . MENTHOL-METHYL SALICYLATE EX Apply topically 4 (four) times daily.    . methocarbamol (ROBAXIN) 750 MG tablet 4 times a day    . mirabegron ER (MYRBETRIQ) 25 MG TB24 tablet Take 25 mg by mouth daily.    Marland Kitchen morphine (MSIR) 15 MG tablet 2 times a day as needed    . potassium chloride (MICRO-K) 10 MEQ CR capsule Take 10 mEq by mouth 2 (two) times daily.    . pravastatin (PRAVACHOL) 40 MG tablet Take 40 mg by mouth daily.    . pregabalin (LYRICA) 50 MG capsule 3 times a day    . QUEtiapine (SEROQUEL) 25 MG tablet Take 25 mg by mouth as needed (insomnia).    . ranitidine (ZANTAC) 150 MG capsule Take 150 mg by mouth 2 (two) times daily.    . TraMADol HCl 100 MG TB24 3 times a day    . triamcinolone cream (KENALOG) 0.1 % Apply topically.     No current facility-administered medications on file  prior to visit.     No Known Allergies  Objective: Physical Exam  General: Well developed, nourished, no acute distress, awake, alert and oriented x 3  Vascular: Dorsalis pedis artery 1/4 bilateral, Posterior tibial artery 1/4 bilateral, skin temperature warm to warm proximal to distal bilateral lower extremities, mild varicosities, pedal hair present bilateral.  Neurological: Gross sensation present via light touch bilateral.   Dermatological: Skin is warm, dry, and supple bilateral, Nails 1-10 are tender, long, thick, and discolored with mild subungal debris, no webspace macerations present bilateral, no open lesions present bilateral, no callus/corns/hyperkeratotic tissue present bilateral.  No signs of infection.  Musculoskeletal: Asymptomatic pes planus boney deformities noted bilateral. Muscular strength within normal limits without painon range of motion. No pain with calf compression  bilateral.  Assessment and Plan:  Problem List Items Addressed This Visit    None    Visit Diagnoses    Pain due to onychomycosis of toenail    -  Primary     -Examined patient.  -Discussed treatment options for painful mycotic nails. -Mechanically debrided and reduced mycotic nails with sterile nail nipper and dremel nail file. -Patient to return in 3 months for follow up evaluation or sooner if symptoms worsen.  Asencion Islamitorya Louvina Cleary, DPM

## 2018-09-17 ENCOUNTER — Ambulatory Visit (INDEPENDENT_AMBULATORY_CARE_PROVIDER_SITE_OTHER): Payer: Non-veteran care | Admitting: Sports Medicine

## 2018-09-17 ENCOUNTER — Encounter: Payer: Self-pay | Admitting: Sports Medicine

## 2018-09-17 ENCOUNTER — Other Ambulatory Visit: Payer: Self-pay

## 2018-09-17 DIAGNOSIS — B351 Tinea unguium: Secondary | ICD-10-CM

## 2018-09-17 DIAGNOSIS — M79676 Pain in unspecified toe(s): Secondary | ICD-10-CM | POA: Diagnosis not present

## 2018-09-17 NOTE — Progress Notes (Signed)
Subjective: Brian Jennings is a 84 y.o. male patient seen today in office with complaint of painful thickened and elongated toenails; unable to trim. Patient denies any changes with medical history. Patient has no other pedal complaints at this time.   Patient Active Problem List   Diagnosis Date Noted  . Sacroiliac inflammation ([place]) 03/11/2018  . Cervical radiculopathy 12/09/2017  . Allergic rhinitis 10/30/2013  . Dyspnea 10/20/2013    Current Outpatient Medications on File Prior to Visit  Medication Sig Dispense Refill  . acetaminophen (TYLENOL) 500 MG tablet Take by mouth.    Marland Kitchen acyclovir (ZOVIRAX) 800 MG tablet Take by mouth.    Marland Kitchen aspirin 81 MG tablet Take 81 mg by mouth daily.    . carvedilol (COREG) 25 MG tablet Take by mouth.    . cetirizine (ZYRTEC) 10 MG tablet Take 10 mg by mouth daily.    . Cholecalciferol 2000 UNITS CAPS Take 1 capsule by mouth daily.    . ciclopirox (PENLAC) 8 % solution     . citalopram (CELEXA) 40 MG tablet Take 40 mg by mouth daily as needed.    . diclofenac sodium (VOLTAREN) 1 % GEL Apply topically.    . diphenhydrAMINE (BENADRYL) 50 MG capsule Take by mouth.    . donepezil (ARICEPT) 10 MG tablet Take 10 mg by mouth at bedtime.    Marland Kitchen doxazosin (CARDURA) 8 MG tablet Take by mouth.    . famciclovir (FAMVIR) 500 MG tablet Take 500 mg by mouth 3 (three) times daily.    . finasteride (PROSCAR) 5 MG tablet Take by mouth.    . fluticasone (FLONASE) 50 MCG/ACT nasal spray Place 2 sprays into both nostrils 2 (two) times daily. 16 g 11  . gabapentin (NEURONTIN) 300 MG capsule     . hydrALAZINE (APRESOLINE) 100 MG tablet Take by mouth.    . isosorbide mononitrate (IMDUR) 30 MG 24 hr tablet Take by mouth.    . levothyroxine (SYNTHROID, LEVOTHROID) 50 MCG tablet Take 50 mcg by mouth daily before breakfast.    . lidocaine (LIDODERM) 5 % 2 times a day    . lisinopril (PRINIVIL,ZESTRIL) 40 MG tablet Take 40 mg by mouth daily.    Marland Kitchen LORazepam (ATIVAN) 0.5 MG tablet  Take by mouth.    . MENTHOL-METHYL SALICYLATE EX Apply topically 4 (four) times daily.    . methocarbamol (ROBAXIN) 750 MG tablet 4 times a day    . mirabegron ER (MYRBETRIQ) 25 MG TB24 tablet Take 25 mg by mouth daily.    Marland Kitchen morphine (MSIR) 15 MG tablet 2 times a day as needed    . mupirocin ointment (BACTROBAN) 2 % APPLY OINTMENT TO AFFECTED AREA(S) THREE TIMES DAILY FOR 7 10 DAYS    . potassium chloride (MICRO-K) 10 MEQ CR capsule Take 10 mEq by mouth 2 (two) times daily.    . pravastatin (PRAVACHOL) 40 MG tablet Take 40 mg by mouth daily.    . pregabalin (LYRICA) 50 MG capsule 3 times a day    . QUEtiapine (SEROQUEL) 25 MG tablet Take 25 mg by mouth as needed (insomnia).    . ranitidine (ZANTAC) 150 MG capsule Take 150 mg by mouth 2 (two) times daily.    Marland Kitchen sulfamethoxazole-trimethoprim (BACTRIM DS) 800-160 MG tablet Take 1 tablet by mouth 2 (two) times daily with a meal.    . TraMADol HCl 100 MG TB24 3 times a day    . triamcinolone cream (KENALOG) 0.1 % Apply topically.  No current facility-administered medications on file prior to visit.     No Known Allergies  Objective: Physical Exam  General: Well developed, nourished, no acute distress, awake, alert and oriented x 3  Vascular: Dorsalis pedis artery 1/4 bilateral, Posterior tibial artery 1/4 bilateral, skin temperature warm to warm proximal to distal bilateral lower extremities, mild varicosities, pedal hair present bilateral.  Neurological: Gross sensation present via light touch bilateral.   Dermatological: Skin is warm, dry, and supple bilateral, Nails 1-10 are tender, long, thick, and discolored with mild subungal debris, no webspace macerations present bilateral, no open lesions present bilateral, no callus/corns/hyperkeratotic tissue present bilateral.  No signs of infection.  Musculoskeletal: Asymptomatic pes planus boney deformities noted bilateral. Muscular strength within normal limits without painon range of  motion. No pain with calf compression bilateral.  Assessment and Plan:  Problem List Items Addressed This Visit    None    Visit Diagnoses    Pain due to onychomycosis of toenail    -  Primary     -Examined patient.  -Discussed treatment options for painful mycotic nails. -Mechanically debrided and reduced mycotic nails with sterile nail nipper and dremel nail file. -Patient to return in 3 months for follow up evaluation or sooner if symptoms worsen.  Asencion Islamitorya Keath Matera, DPM

## 2018-09-18 ENCOUNTER — Ambulatory Visit: Payer: Non-veteran care | Admitting: Sports Medicine

## 2018-12-17 ENCOUNTER — Ambulatory Visit: Payer: Non-veteran care | Admitting: Sports Medicine

## 2018-12-18 ENCOUNTER — Ambulatory Visit (INDEPENDENT_AMBULATORY_CARE_PROVIDER_SITE_OTHER): Payer: No Typology Code available for payment source | Admitting: Sports Medicine

## 2018-12-18 ENCOUNTER — Other Ambulatory Visit: Payer: Self-pay

## 2018-12-18 ENCOUNTER — Encounter: Payer: Self-pay | Admitting: Sports Medicine

## 2018-12-18 DIAGNOSIS — B351 Tinea unguium: Secondary | ICD-10-CM | POA: Diagnosis not present

## 2018-12-18 DIAGNOSIS — M79676 Pain in unspecified toe(s): Secondary | ICD-10-CM

## 2018-12-18 NOTE — Progress Notes (Signed)
Subjective: Brian Jennings is a 84 y.o. male patient seen today in office with complaint of painful thickened and elongated toenails; unable to trim. Patient denies any changes with medical history. Patient has no other pedal complaints at this time.   Patient admits that he has to get a new authorization from the [place] and he is worried that they may make him start going to the [place] in [place] to get his nails trimmed.  Patient Active Problem List   Diagnosis Date Noted  . Sacroiliac inflammation ([facility]) 03/11/2018  . Cervical radiculopathy 12/09/2017  . Allergic rhinitis 10/30/2013  . Dyspnea 10/20/2013    Current Outpatient Medications on File Prior to Visit  Medication Sig Dispense Refill  . acetaminophen (TYLENOL) 500 MG tablet Take by mouth.    Marland Kitchen acyclovir (ZOVIRAX) 800 MG tablet Take by mouth.    Marland Kitchen aspirin 81 MG tablet Take 81 mg by mouth daily.    . carvedilol (COREG) 25 MG tablet Take by mouth.    . cetirizine (ZYRTEC) 10 MG tablet Take 10 mg by mouth daily.    . Cholecalciferol 2000 UNITS CAPS Take 1 capsule by mouth daily.    . ciclopirox (PENLAC) 8 % solution     . citalopram (CELEXA) 40 MG tablet Take 40 mg by mouth daily as needed.    . diclofenac sodium (VOLTAREN) 1 % GEL Apply topically.    . diphenhydrAMINE (BENADRYL) 50 MG capsule Take by mouth.    . donepezil (ARICEPT) 10 MG tablet Take 10 mg by mouth at bedtime.    Marland Kitchen doxazosin (CARDURA) 8 MG tablet Take by mouth.    . famciclovir (FAMVIR) 500 MG tablet Take 500 mg by mouth 3 (three) times daily.    . finasteride (PROSCAR) 5 MG tablet Take by mouth.    . fluticasone (FLONASE) 50 MCG/ACT nasal spray Place 2 sprays into both nostrils 2 (two) times daily. 16 g 11  . gabapentin (NEURONTIN) 300 MG capsule     . hydrALAZINE (APRESOLINE) 100 MG tablet Take by mouth.    . isosorbide mononitrate (IMDUR) 30 MG 24 hr tablet Take by mouth.    . levothyroxine (SYNTHROID, LEVOTHROID) 50 MCG tablet Take 50 mcg by mouth daily  before breakfast.    . lidocaine (LIDODERM) 5 % 2 times a day    . lisinopril (PRINIVIL,ZESTRIL) 40 MG tablet Take 40 mg by mouth daily.    Marland Kitchen LORazepam (ATIVAN) 0.5 MG tablet Take by mouth.    . MENTHOL-METHYL SALICYLATE EX Apply topically 4 (four) times daily.    . methocarbamol (ROBAXIN) 750 MG tablet 4 times a day    . mirabegron ER (MYRBETRIQ) 25 MG TB24 tablet Take 25 mg by mouth daily.    Marland Kitchen morphine (MSIR) 15 MG tablet 2 times a day as needed    . mupirocin ointment (BACTROBAN) 2 % APPLY OINTMENT TO AFFECTED AREA(S) THREE TIMES DAILY FOR 7 10 DAYS    . potassium chloride (MICRO-K) 10 MEQ CR capsule Take 10 mEq by mouth 2 (two) times daily.    . pravastatin (PRAVACHOL) 40 MG tablet Take 40 mg by mouth daily.    . pregabalin (LYRICA) 50 MG capsule 3 times a day    . QUEtiapine (SEROQUEL) 25 MG tablet Take 25 mg by mouth as needed (insomnia).    . ranitidine (ZANTAC) 150 MG capsule Take 150 mg by mouth 2 (two) times daily.    Marland Kitchen sulfamethoxazole-trimethoprim (BACTRIM DS) 800-160 MG tablet Take 1 tablet by  mouth 2 (two) times daily with a meal.    . TraMADol HCl 100 MG TB24 3 times a day    . triamcinolone cream (KENALOG) 0.1 % Apply topically.     No current facility-administered medications on file prior to visit.     Allergies  Allergen Reactions  . Amlodipine Swelling  . Clonidine Other (See Comments)    Objective: Physical Exam  General: Well developed, nourished, no acute distress, awake, alert and oriented x 3  Vascular: Dorsalis pedis artery 1/4 bilateral, Posterior tibial artery 1/4 bilateral, skin temperature warm to warm proximal to distal bilateral lower extremities, mild varicosities, pedal hair present bilateral.  Neurological: Gross sensation present via light touch bilateral.   Dermatological: Skin is warm, dry, and supple bilateral, Nails 1-10 are tender, long, thick, and discolored with mild subungal debris, no webspace macerations present bilateral, no open  lesions present bilateral, no callus/corns/hyperkeratotic tissue present bilateral.  No signs of infection.  Musculoskeletal: Asymptomatic pes planus boney deformities noted bilateral. Muscular strength within normal limits without painon range of motion. No pain with calf compression bilateral.  Assessment and Plan:  Problem List Items Addressed This Visit    None    Visit Diagnoses    Pain due to onychomycosis of toenail    -  Primary     -Examined patient.  -Re-Discussed treatment options for painful mycotic nails. -Mechanically debrided and reduced mycotic nails with sterile nail nipper and dremel nail file. -Patient to return in 3 months for follow up evaluation or sooner if symptoms worsen.  Landis Martins, DPM

## 2019-02-10 ENCOUNTER — Telehealth: Payer: Self-pay

## 2019-02-10 NOTE — Telephone Encounter (Signed)
Spoke with patient.

## 2019-03-03 DIAGNOSIS — L0100 Impetigo, unspecified: Secondary | ICD-10-CM | POA: Diagnosis not present

## 2019-03-03 DIAGNOSIS — L570 Actinic keratosis: Secondary | ICD-10-CM | POA: Diagnosis not present

## 2019-03-03 DIAGNOSIS — L299 Pruritus, unspecified: Secondary | ICD-10-CM | POA: Diagnosis not present

## 2019-03-06 ENCOUNTER — Other Ambulatory Visit: Payer: Self-pay

## 2019-03-06 ENCOUNTER — Ambulatory Visit (INDEPENDENT_AMBULATORY_CARE_PROVIDER_SITE_OTHER): Payer: No Typology Code available for payment source | Admitting: Family

## 2019-03-06 ENCOUNTER — Encounter: Payer: Self-pay | Admitting: Family

## 2019-03-06 VITALS — BP 144/78 | HR 72 | Temp 98.1°F | Ht 70.0 in | Wt 244.0 lb

## 2019-03-06 DIAGNOSIS — F419 Anxiety disorder, unspecified: Secondary | ICD-10-CM

## 2019-03-06 DIAGNOSIS — F329 Major depressive disorder, single episode, unspecified: Secondary | ICD-10-CM

## 2019-03-06 DIAGNOSIS — M461 Sacroiliitis, not elsewhere classified: Secondary | ICD-10-CM

## 2019-03-06 DIAGNOSIS — I10 Essential (primary) hypertension: Secondary | ICD-10-CM | POA: Diagnosis not present

## 2019-03-06 DIAGNOSIS — E039 Hypothyroidism, unspecified: Secondary | ICD-10-CM

## 2019-03-06 DIAGNOSIS — F32A Depression, unspecified: Secondary | ICD-10-CM

## 2019-03-06 DIAGNOSIS — J309 Allergic rhinitis, unspecified: Secondary | ICD-10-CM

## 2019-03-06 DIAGNOSIS — E785 Hyperlipidemia, unspecified: Secondary | ICD-10-CM

## 2019-03-06 DIAGNOSIS — N400 Enlarged prostate without lower urinary tract symptoms: Secondary | ICD-10-CM

## 2019-03-06 DIAGNOSIS — J45909 Unspecified asthma, uncomplicated: Secondary | ICD-10-CM

## 2019-03-06 NOTE — Progress Notes (Signed)
Brian Jennings is a 85 y.o. male with the following history as recorded in [employer]:  Patient Active Problem List   Diagnosis Date Noted  . Sacroiliac inflammation ([facility]) 03/11/2018  . Cervical radiculopathy 12/09/2017  . Allergic rhinitis 10/30/2013  . Dyspnea 10/20/2013    Current Outpatient Medications  Medication Sig Dispense Refill  . acetaminophen (TYLENOL) 500 MG tablet Take by mouth.    Marland Kitchen acyclovir (ZOVIRAX) 800 MG tablet Take by mouth.    Marland Kitchen aspirin 81 MG tablet Take 81 mg by mouth daily.    . carvedilol (COREG) 25 MG tablet Take by mouth.    . cetirizine (ZYRTEC) 10 MG tablet Take 10 mg by mouth daily.    . Cholecalciferol 2000 UNITS CAPS Take 1 capsule by mouth daily.    . ciclopirox (PENLAC) 8 % solution     . citalopram (CELEXA) 40 MG tablet Take 40 mg by mouth daily as needed.    . diclofenac sodium (VOLTAREN) 1 % GEL Apply topically.    . diphenhydrAMINE (BENADRYL) 50 MG capsule Take by mouth.    . donepezil (ARICEPT) 10 MG tablet Take 10 mg by mouth at bedtime.    Marland Kitchen doxazosin (CARDURA) 8 MG tablet Take by mouth.    . famciclovir (FAMVIR) 500 MG tablet Take 500 mg by mouth 3 (three) times daily.    . finasteride (PROSCAR) 5 MG tablet Take by mouth.    . fluticasone (FLONASE) 50 MCG/ACT nasal spray Place 2 sprays into both nostrils 2 (two) times daily. 16 g 11  . gabapentin (NEURONTIN) 300 MG capsule     . hydrALAZINE (APRESOLINE) 100 MG tablet Take by mouth.    . isosorbide mononitrate (IMDUR) 30 MG 24 hr tablet Take by mouth.    . levothyroxine (SYNTHROID, LEVOTHROID) 50 MCG tablet Take 50 mcg by mouth daily before breakfast.    . lidocaine (LIDODERM) 5 % 2 times a day    . lisinopril (PRINIVIL,ZESTRIL) 40 MG tablet Take 40 mg by mouth daily.    Marland Kitchen LORazepam (ATIVAN) 0.5 MG tablet Take by mouth.    . MENTHOL-METHYL SALICYLATE EX Apply topically 4 (four) times daily.    . methocarbamol (ROBAXIN) 750 MG tablet 4 times a day    . mirabegron ER (MYRBETRIQ) 25 MG TB24  tablet Take 25 mg by mouth daily.    Marland Kitchen morphine (MSIR) 15 MG tablet 2 times a day as needed    . mupirocin ointment (BACTROBAN) 2 % APPLY OINTMENT TO AFFECTED AREA(S) THREE TIMES DAILY FOR 7 10 DAYS    . potassium chloride (MICRO-K) 10 MEQ CR capsule Take 10 mEq by mouth 2 (two) times daily.    . pravastatin (PRAVACHOL) 40 MG tablet Take 40 mg by mouth daily.    . pregabalin (LYRICA) 50 MG capsule 3 times a day    . QUEtiapine (SEROQUEL) 25 MG tablet Take 25 mg by mouth as needed (insomnia).    . ranitidine (ZANTAC) 150 MG capsule Take 150 mg by mouth 2 (two) times daily.    Marland Kitchen sulfamethoxazole-trimethoprim (BACTRIM DS) 800-160 MG tablet Take 1 tablet by mouth 2 (two) times daily with a meal.    . TraMADol HCl 100 MG TB24 3 times a day    . triamcinolone cream (KENALOG) 0.1 % Apply topically.     No current facility-administered medications for this visit.    Allergies: Amlodipine and Clonidine  Past Medical History:  Diagnosis Date  . Anxiety   . Depression   .  GERD (gastroesophageal reflux disease)   . HTN (hypertension)   . Hyperlipidemia   . Sleep apnea    treatment with CPAP    Past Surgical History:  Procedure Laterality Date  . BACK SURGERY  2012  . CYSTOSCOPY PROSTATE W/ LASER  1997  . WISDOM TOOTH EXTRACTION  age 46    Family History  Problem Relation Age of Onset  . Heart disease Father   . Pancreatic cancer Brother     Social History   Tobacco Use  . Smoking status: Never Smoker  . Smokeless tobacco: Never Used  Substance Use Topics  . Alcohol use: Yes    Comment: mild     Subjective:  Patient is accompanied by his wife today;  very pleasant gentleman presents as a new patient today; his healthcare needs are predominantly managed by the [facility] and he plans to keep his primary care, cardiology and neurology needs managed by the [place]; he notes he just wanted a "back up" option if he couldn't get into see his PCP at the [place] in a timely manner; was under the impression  that he needed to have a PCP in the [place] if he wanted to be seen at [facility] ER. Denies any chest pain, shortness of breath, blurred vision or headache.    Objective:  Vitals:   03/06/19 1407  BP: (!) 144/78  Pulse: 72  Temp: 98.1 F (36.7 C)  TempSrc: Oral  SpO2: 96%  Weight: 244 lb (110.7 kg)  Height: 5\' 10"  (1.778 m)    General: Well developed, well nourished, in no acute distress  Skin : Warm and dry.  Head: Normocephalic and atraumatic  Lungs: Respirations unlabored; clear to auscultation bilaterally without wheeze, rales, rhonchi  CVS exam: normal rate and regular rhythm.  Neurologic: Alert and oriented; speech intact; face symmetrical; moves all extremities well; CNII-XII intact without focal deficit   Assessment:  1. Essential hypertension   2. Allergic rhinitis, unspecified seasonality, unspecified trigger   3. Anxiety and depression   4. Hypothyroidism, unspecified type   5. Hyperlipidemia, unspecified hyperlipidemia type   6. Uncomplicated asthma, unspecified asthma severity, unspecified whether persistent   7. Benign prostatic hyperplasia, unspecified whether lower urinary tract symptoms present   8. Sacroiliac inflammation (HCC) Chronic    Plan:  Chronic care needs are stable; medications are reviewed with patient; patient understands that it is unsafe to have two different primary care providers- he will plan to continue management and follow-up with the [place]; he understands he is always welcome here and we can help with urgent care needs; Long discussion about advanced directives and paperwork provided to patient; both he and his wife will plan to get updated paperwork;   This visit occurred during the SARS-CoV-2 public health emergency.  Safety protocols were in place, including screening questions prior to the visit, additional usage of staff PPE, and extensive cleaning of exam room while observing appropriate contact time as indicated for disinfecting  solutions.     No follow-ups on file.  No orders of the defined types were placed in this encounter.   Requested Prescriptions    No prescriptions requested or ordered in this encounter

## 2019-03-19 ENCOUNTER — Ambulatory Visit: Payer: Non-veteran care | Admitting: Sports Medicine

## 2019-03-19 ENCOUNTER — Other Ambulatory Visit: Payer: Self-pay

## 2019-03-19 ENCOUNTER — Encounter: Payer: Self-pay | Admitting: Sports Medicine

## 2019-03-19 ENCOUNTER — Ambulatory Visit (INDEPENDENT_AMBULATORY_CARE_PROVIDER_SITE_OTHER): Payer: Medicare HMO | Admitting: Sports Medicine

## 2019-03-19 DIAGNOSIS — M79676 Pain in unspecified toe(s): Secondary | ICD-10-CM

## 2019-03-19 DIAGNOSIS — B351 Tinea unguium: Secondary | ICD-10-CM

## 2019-03-19 NOTE — Progress Notes (Signed)
Subjective: Brian Jennings is a 85 y.o. male patient seen today in office with complaint of painful thickened and elongated toenails; unable to trim. Patient denies any changes with medical history. Patient has no other pedal complaints at this time.   Patient Active Problem List   Diagnosis Date Noted  . Sacroiliac inflammation ([place]) 03/11/2018  . Cervical radiculopathy 12/09/2017  . Allergic rhinitis 10/30/2013  . Dyspnea 10/20/2013    Current Outpatient Medications on File Prior to Visit  Medication Sig Dispense Refill  . acetaminophen (TYLENOL) 500 MG tablet Take by mouth.    Marland Kitchen acyclovir (ZOVIRAX) 800 MG tablet Take by mouth.    Marland Kitchen aspirin 81 MG tablet Take 81 mg by mouth daily.    . carvedilol (COREG) 25 MG tablet Take by mouth.    . cetirizine (ZYRTEC) 10 MG tablet Take 10 mg by mouth daily.    . Cholecalciferol 2000 UNITS CAPS Take 1 capsule by mouth daily.    . ciclopirox (PENLAC) 8 % solution     . citalopram (CELEXA) 40 MG tablet Take 40 mg by mouth daily as needed.    . diclofenac sodium (VOLTAREN) 1 % GEL Apply topically.    . diphenhydrAMINE (BENADRYL) 50 MG capsule Take by mouth.    . donepezil (ARICEPT) 10 MG tablet Take 10 mg by mouth at bedtime.    Marland Kitchen doxazosin (CARDURA) 8 MG tablet Take by mouth.    . famciclovir (FAMVIR) 500 MG tablet Take 500 mg by mouth 3 (three) times daily.    . finasteride (PROSCAR) 5 MG tablet Take by mouth.    . fluticasone (FLONASE) 50 MCG/ACT nasal spray Place 2 sprays into both nostrils 2 (two) times daily. 16 g 11  . gabapentin (NEURONTIN) 300 MG capsule     . hydrALAZINE (APRESOLINE) 100 MG tablet Take by mouth.    . isosorbide mononitrate (IMDUR) 30 MG 24 hr tablet Take by mouth.    . levothyroxine (SYNTHROID, LEVOTHROID) 50 MCG tablet Take 50 mcg by mouth daily before breakfast.    . lidocaine (LIDODERM) 5 % 2 times a day    . lisinopril (PRINIVIL,ZESTRIL) 40 MG tablet Take 40 mg by mouth daily.    Marland Kitchen LORazepam (ATIVAN) 0.5 MG tablet  Take by mouth.    . MENTHOL-METHYL SALICYLATE EX Apply topically 4 (four) times daily.    . methocarbamol (ROBAXIN) 750 MG tablet 4 times a day    . mirabegron ER (MYRBETRIQ) 25 MG TB24 tablet Take 25 mg by mouth daily.    Marland Kitchen morphine (MSIR) 15 MG tablet 2 times a day as needed    . mupirocin ointment (BACTROBAN) 2 % APPLY OINTMENT TO AFFECTED AREA(S) THREE TIMES DAILY FOR 7 10 DAYS    . potassium chloride (MICRO-K) 10 MEQ CR capsule Take 10 mEq by mouth 2 (two) times daily.    . pravastatin (PRAVACHOL) 40 MG tablet Take 40 mg by mouth daily.    . pregabalin (LYRICA) 50 MG capsule 3 times a day    . QUEtiapine (SEROQUEL) 25 MG tablet Take 25 mg by mouth as needed (insomnia).    . ranitidine (ZANTAC) 150 MG capsule Take 150 mg by mouth 2 (two) times daily.    Marland Kitchen sulfamethoxazole-trimethoprim (BACTRIM DS) 800-160 MG tablet Take 1 tablet by mouth 2 (two) times daily with a meal.    . TraMADol HCl 100 MG TB24 3 times a day    . triamcinolone cream (KENALOG) 0.1 % Apply topically.  No current facility-administered medications on file prior to visit.    Allergies  Allergen Reactions  . Amlodipine Swelling  . Clonidine Other (See Comments)    Objective: Physical Exam  General: Well developed, nourished, no acute distress, awake, alert and oriented x 3  Vascular: Dorsalis pedis artery 1/4 bilateral, Posterior tibial artery 1/4 bilateral, skin temperature warm to warm proximal to distal bilateral lower extremities, mild varicosities, pedal hair present bilateral.  Neurological: Gross sensation present via light touch bilateral.   Dermatological: Skin is warm, dry, and supple bilateral, Nails 1-10 are tender, long, thick, and discolored with mild subungal debris, no webspace macerations present bilateral, no open lesions present bilateral, no callus/corns/hyperkeratotic tissue present bilateral.  No signs of infection.  Musculoskeletal: Asymptomatic pes planus boney deformities noted  bilateral. Muscular strength within normal limits without painon range of motion. No pain with calf compression bilateral.  Assessment and Plan:  Problem List Items Addressed This Visit    None    Visit Diagnoses    Pain due to onychomycosis of toenail    -  Primary     -Examined patient.  -Re-Discussed treatment options for painful mycotic nails. -Mechanically debrided and reduced mycotic nails with sterile nail nipper and dremel nail file. -Continue with good supportive shoes daily for foot type -Patient to return in 3 months for follow up evaluation or sooner if symptoms worsen.  Asencion Islam, DPM

## 2019-06-17 ENCOUNTER — Ambulatory Visit (INDEPENDENT_AMBULATORY_CARE_PROVIDER_SITE_OTHER): Payer: No Typology Code available for payment source | Admitting: Sports Medicine

## 2019-06-17 ENCOUNTER — Encounter: Payer: Self-pay | Admitting: Sports Medicine

## 2019-06-17 ENCOUNTER — Other Ambulatory Visit: Payer: Self-pay

## 2019-06-17 DIAGNOSIS — B351 Tinea unguium: Secondary | ICD-10-CM | POA: Diagnosis not present

## 2019-06-17 DIAGNOSIS — M79676 Pain in unspecified toe(s): Secondary | ICD-10-CM | POA: Diagnosis not present

## 2019-06-17 NOTE — Progress Notes (Signed)
Subjective: Brian Jennings is a 85 y.o. male patient seen today in office with complaint of painful thickened and elongated toenails; unable to trim. Patient denies any changes with medical history.  Reports that he does not like to wait and has waited only 10 minutes this visit.  Patient reports that he has a retirement party to go to at 4:00.  Patient has no other pedal complaints at this time.   Patient Active Problem List   Diagnosis Date Noted  . Sacroiliac inflammation (HCC) 03/11/2018  . Cervical radiculopathy 12/09/2017  . Allergic rhinitis 10/30/2013  . Dyspnea 10/20/2013    Current Outpatient Medications on File Prior to Visit  Medication Sig Dispense Refill  . acetaminophen (TYLENOL) 500 MG tablet Take by mouth.    Marland Kitchen acyclovir (ZOVIRAX) 800 MG tablet Take by mouth.    Marland Kitchen aspirin 81 MG tablet Take 81 mg by mouth daily.    . carvedilol (COREG) 25 MG tablet Take by mouth.    . cetirizine (ZYRTEC) 10 MG tablet Take 10 mg by mouth daily.    . Cholecalciferol 2000 UNITS CAPS Take 1 capsule by mouth daily.    . ciclopirox (PENLAC) 8 % solution     . citalopram (CELEXA) 40 MG tablet Take 40 mg by mouth daily as needed.    . diclofenac sodium (VOLTAREN) 1 % GEL Apply topically.    . diphenhydrAMINE (BENADRYL) 50 MG capsule Take by mouth.    . donepezil (ARICEPT) 10 MG tablet Take 10 mg by mouth at bedtime.    Marland Kitchen doxazosin (CARDURA) 8 MG tablet Take by mouth.    . famciclovir (FAMVIR) 500 MG tablet Take 500 mg by mouth 3 (three) times daily.    . finasteride (PROSCAR) 5 MG tablet Take by mouth.    . fluticasone (FLONASE) 50 MCG/ACT nasal spray Place 2 sprays into both nostrils 2 (two) times daily. 16 g 11  . gabapentin (NEURONTIN) 300 MG capsule     . hydrALAZINE (APRESOLINE) 100 MG tablet Take by mouth.    . isosorbide mononitrate (IMDUR) 30 MG 24 hr tablet Take by mouth.    . levothyroxine (SYNTHROID, LEVOTHROID) 50 MCG tablet Take 50 mcg by mouth daily before breakfast.    .  lidocaine (LIDODERM) 5 % 2 times a day    . lisinopril (PRINIVIL,ZESTRIL) 40 MG tablet Take 40 mg by mouth daily.    Marland Kitchen LORazepam (ATIVAN) 0.5 MG tablet Take by mouth.    . MENTHOL-METHYL SALICYLATE EX Apply topically 4 (four) times daily.    . methocarbamol (ROBAXIN) 750 MG tablet 4 times a day    . mirabegron ER (MYRBETRIQ) 25 MG TB24 tablet Take 25 mg by mouth daily.    Marland Kitchen morphine (MSIR) 15 MG tablet 2 times a day as needed    . mupirocin ointment (BACTROBAN) 2 % APPLY OINTMENT TO AFFECTED AREA(S) THREE TIMES DAILY FOR 7 10 DAYS    . potassium chloride (MICRO-K) 10 MEQ CR capsule Take 10 mEq by mouth 2 (two) times daily.    . pravastatin (PRAVACHOL) 40 MG tablet Take 40 mg by mouth daily.    . pregabalin (LYRICA) 50 MG capsule 3 times a day    . QUEtiapine (SEROQUEL) 25 MG tablet Take 25 mg by mouth as needed (insomnia).    . ranitidine (ZANTAC) 150 MG capsule Take 150 mg by mouth 2 (two) times daily.    Marland Kitchen sulfamethoxazole-trimethoprim (BACTRIM DS) 800-160 MG tablet Take 1 tablet by mouth 2 (two)  times daily with a meal.    . TraMADol HCl 100 MG TB24 3 times a day    . triamcinolone cream (KENALOG) 0.1 % Apply topically.     No current facility-administered medications on file prior to visit.    Allergies  Allergen Reactions  . Amlodipine Swelling  . Clonidine Other (See Comments)    Objective: Physical Exam  General: Well developed, nourished, no acute distress, awake, alert and oriented x 3  Vascular: Dorsalis pedis artery 1/4 bilateral, Posterior tibial artery 1/4 bilateral, skin temperature warm to warm proximal to distal bilateral lower extremities, mild varicosities, pedal hair present bilateral.  Neurological: Gross sensation present via light touch bilateral.   Dermatological: Skin is warm, dry, and supple bilateral, Nails 1-10 are tender, long, thick, and discolored with mild subungal debris, no webspace macerations present bilateral, no open lesions present bilateral,  no callus/corns/hyperkeratotic tissue present bilateral.  No signs of infection.  Musculoskeletal: Asymptomatic pes planus boney deformities noted bilateral. Muscular strength within normal limits without painon range of motion. No pain with calf compression bilateral.  Assessment and Plan:  Problem List Items Addressed This Visit    None    Visit Diagnoses    Pain due to onychomycosis of toenail    -  Primary     -Examined patient.  -Re-Discussed treatment options for painful mycotic nails. -Mechanically debrided and reduced mycotic nails with sterile nail nipper and dremel nail file. -Continue with good supportive shoes daily for foot type -Patient to return in 3 months for follow up evaluation or sooner if symptoms worsen.  At checkout we attempted to accommodate patient as far as timing so that way he did not have to wait for his next nail trim excessively long for me however it is of note patient showed up late for his appointment so to compromise with him we told him that we can give him the first available appointment in the morning or the afternoon however he must show up on time in order to ensure that he would not have to wait for his nails to be trimmed  Landis Martins, DPM

## 2019-09-16 ENCOUNTER — Ambulatory Visit (INDEPENDENT_AMBULATORY_CARE_PROVIDER_SITE_OTHER): Payer: Non-veteran care | Admitting: Sports Medicine

## 2019-09-16 ENCOUNTER — Other Ambulatory Visit: Payer: Self-pay

## 2019-09-16 ENCOUNTER — Encounter: Payer: Self-pay | Admitting: Sports Medicine

## 2019-09-16 DIAGNOSIS — M79676 Pain in unspecified toe(s): Secondary | ICD-10-CM

## 2019-09-16 DIAGNOSIS — B351 Tinea unguium: Secondary | ICD-10-CM | POA: Diagnosis not present

## 2019-09-16 NOTE — Progress Notes (Signed)
Subjective: Contrell Ballentine is a 85 y.o. male patient seen today in office with complaint of painful thickened and elongated toenails; unable to trim. Patient denies any changes in health or medications since last encounter.  Pedal complaints noted.  Patient Active Problem List   Diagnosis Date Noted  . Sacroiliac inflammation (HCC) 03/11/2018  . Cervical radiculopathy 12/09/2017  . Allergic rhinitis 10/30/2013  . Dyspnea 10/20/2013    Current Outpatient Medications on File Prior to Visit  Medication Sig Dispense Refill  . acetaminophen (TYLENOL) 500 MG tablet Take by mouth.    Marland Kitchen acyclovir (ZOVIRAX) 800 MG tablet Take by mouth.    Marland Kitchen aspirin 81 MG tablet Take 81 mg by mouth daily.    . carvedilol (COREG) 25 MG tablet Take by mouth.    . cetirizine (ZYRTEC) 10 MG tablet Take 10 mg by mouth daily.    . Cholecalciferol 2000 UNITS CAPS Take 1 capsule by mouth daily.    . ciclopirox (PENLAC) 8 % solution     . citalopram (CELEXA) 40 MG tablet Take 40 mg by mouth daily as needed.    . diclofenac sodium (VOLTAREN) 1 % GEL Apply topically.    . diphenhydrAMINE (BENADRYL) 50 MG capsule Take by mouth.    . donepezil (ARICEPT) 10 MG tablet Take 10 mg by mouth at bedtime.    Marland Kitchen doxazosin (CARDURA) 8 MG tablet Take by mouth.    . famciclovir (FAMVIR) 500 MG tablet Take 500 mg by mouth 3 (three) times daily.    . finasteride (PROSCAR) 5 MG tablet Take by mouth.    . fluticasone (FLONASE) 50 MCG/ACT nasal spray Place 2 sprays into both nostrils 2 (two) times daily. 16 g 11  . gabapentin (NEURONTIN) 300 MG capsule     . hydrALAZINE (APRESOLINE) 100 MG tablet Take by mouth.    . isosorbide mononitrate (IMDUR) 30 MG 24 hr tablet Take by mouth.    . levothyroxine (SYNTHROID, LEVOTHROID) 50 MCG tablet Take 50 mcg by mouth daily before breakfast.    . lidocaine (LIDODERM) 5 % 2 times a day    . lisinopril (PRINIVIL,ZESTRIL) 40 MG tablet Take 40 mg by mouth daily.    Marland Kitchen LORazepam (ATIVAN) 0.5 MG tablet  Take by mouth.    . MENTHOL-METHYL SALICYLATE EX Apply topically 4 (four) times daily.    . methocarbamol (ROBAXIN) 750 MG tablet 4 times a day    . mirabegron ER (MYRBETRIQ) 25 MG TB24 tablet Take 25 mg by mouth daily.    Marland Kitchen morphine (MSIR) 15 MG tablet 2 times a day as needed    . mupirocin ointment (BACTROBAN) 2 % APPLY OINTMENT TO AFFECTED AREA(S) THREE TIMES DAILY FOR 7 10 DAYS    . potassium chloride (MICRO-K) 10 MEQ CR capsule Take 10 mEq by mouth 2 (two) times daily.    . pravastatin (PRAVACHOL) 40 MG tablet Take 40 mg by mouth daily.    . pregabalin (LYRICA) 50 MG capsule 3 times a day    . QUEtiapine (SEROQUEL) 25 MG tablet Take 25 mg by mouth as needed (insomnia).    . ranitidine (ZANTAC) 150 MG capsule Take 150 mg by mouth 2 (two) times daily.    Marland Kitchen sulfamethoxazole-trimethoprim (BACTRIM DS) 800-160 MG tablet Take 1 tablet by mouth 2 (two) times daily with a meal.    . TraMADol HCl 100 MG TB24 3 times a day    . triamcinolone cream (KENALOG) 0.1 % Apply topically.     No  current facility-administered medications on file prior to visit.    Allergies  Allergen Reactions  . Amlodipine Swelling  . Clonidine Other (See Comments)    Objective: Physical Exam  General: Well developed, nourished, no acute distress, awake, alert and oriented x 3  Vascular: Dorsalis pedis artery 1/4 bilateral, Posterior tibial artery 1/4 bilateral, skin temperature warm to warm proximal to distal bilateral lower extremities, mild varicosities, pedal hair present bilateral.  Neurological: Gross sensation present via light touch bilateral.   Dermatological: Skin is warm, dry, and supple bilateral, Nails 1-10 are tender, long, thick, and discolored with mild subungal debris, no webspace macerations present bilateral, no open lesions present bilateral, no callus/corns/hyperkeratotic tissue present bilateral.  No signs of infection.  Musculoskeletal: Asymptomatic pes planus boney deformities noted  bilateral. Muscular strength within normal limits without painon range of motion. No pain with calf compression bilateral.  Assessment and Plan:  Problem List Items Addressed This Visit    None    Visit Diagnoses    Pain due to onychomycosis of toenail    -  Primary     -Examined patient.  -Re-Discussed treatment options for painful mycotic nails. -Mechanically debrided and reduced mycotic nails with sterile nail nipper and dremel nail file. -Encouraged daily skin emollients  -Return to office in 3 months or sooner if problems or issues arise  Asencion Islam, DPM

## 2019-12-22 ENCOUNTER — Encounter: Payer: Self-pay | Admitting: Sports Medicine

## 2019-12-22 ENCOUNTER — Ambulatory Visit (INDEPENDENT_AMBULATORY_CARE_PROVIDER_SITE_OTHER): Payer: Medicare HMO | Admitting: Sports Medicine

## 2019-12-22 ENCOUNTER — Other Ambulatory Visit: Payer: Self-pay

## 2019-12-22 DIAGNOSIS — B351 Tinea unguium: Secondary | ICD-10-CM

## 2019-12-22 DIAGNOSIS — M79676 Pain in unspecified toe(s): Secondary | ICD-10-CM

## 2019-12-22 NOTE — Progress Notes (Signed)
Subjective: Brian Jennings is a 85 y.o. male patient seen today in office with complaint of painful thickened and elongated toenails; unable to trim. Patient denies any changes in health or medications since last encounter.  Patient got [facility] authorization for this visit.  No other pedal complaints noted.  Patient Active Problem List   Diagnosis Date Noted  . DDD (degenerative disc disease), lumbar 06/05/2018  . Sacroiliac inflammation (HCC) 03/11/2018  . Cervical radiculopathy 12/09/2017  . Allergic rhinitis 10/30/2013  . Dyspnea 10/20/2013    Current Outpatient Medications on File Prior to Visit  Medication Sig Dispense Refill  . acetaminophen (TYLENOL) 500 MG tablet Take by mouth.    Marland Kitchen acyclovir (ZOVIRAX) 800 MG tablet Take by mouth.    Marland Kitchen aspirin 81 MG tablet Take 81 mg by mouth daily.    . carvedilol (COREG) 25 MG tablet Take by mouth.    . cetirizine (ZYRTEC) 10 MG tablet Take 10 mg by mouth daily.    . Cholecalciferol 2000 UNITS CAPS Take 1 capsule by mouth daily.    . ciclopirox (PENLAC) 8 % solution     . citalopram (CELEXA) 40 MG tablet Take 40 mg by mouth daily as needed.    . diclofenac sodium (VOLTAREN) 1 % GEL Apply topically.    . diphenhydrAMINE (BENADRYL) 50 MG capsule Take by mouth.    . donepezil (ARICEPT) 10 MG tablet Take 10 mg by mouth at bedtime.    Marland Kitchen doxazosin (CARDURA) 8 MG tablet Take by mouth.    . famciclovir (FAMVIR) 500 MG tablet Take 500 mg by mouth 3 (three) times daily.    . finasteride (PROSCAR) 5 MG tablet Take by mouth.    . fluticasone (FLONASE) 50 MCG/ACT nasal spray Place 2 sprays into both nostrils 2 (two) times daily. 16 g 11  . gabapentin (NEURONTIN) 300 MG capsule     . hydrALAZINE (APRESOLINE) 100 MG tablet Take by mouth.    . isosorbide mononitrate (IMDUR) 30 MG 24 hr tablet Take by mouth.    . levothyroxine (SYNTHROID, LEVOTHROID) 50 MCG tablet Take 50 mcg by mouth daily before breakfast.    . lidocaine (LIDODERM) 5 % 2 times a day    .  lisinopril (PRINIVIL,ZESTRIL) 40 MG tablet Take 40 mg by mouth daily.    Marland Kitchen LORazepam (ATIVAN) 0.5 MG tablet Take by mouth.    . MENTHOL-METHYL SALICYLATE EX Apply topically 4 (four) times daily.    . methocarbamol (ROBAXIN) 750 MG tablet 4 times a day    . mirabegron ER (MYRBETRIQ) 25 MG TB24 tablet Take 25 mg by mouth daily.    Marland Kitchen morphine (MSIR) 15 MG tablet 2 times a day as needed    . mupirocin ointment (BACTROBAN) 2 % APPLY OINTMENT TO AFFECTED AREA(S) THREE TIMES DAILY FOR 7 10 DAYS    . potassium chloride (MICRO-K) 10 MEQ CR capsule Take 10 mEq by mouth 2 (two) times daily.    . pravastatin (PRAVACHOL) 40 MG tablet Take 40 mg by mouth daily.    . pregabalin (LYRICA) 50 MG capsule 3 times a day    . QUEtiapine (SEROQUEL) 25 MG tablet Take 25 mg by mouth as needed (insomnia).    . ranitidine (ZANTAC) 150 MG capsule Take 150 mg by mouth 2 (two) times daily.    Marland Kitchen sulfamethoxazole-trimethoprim (BACTRIM DS) 800-160 MG tablet Take 1 tablet by mouth 2 (two) times daily with a meal.    . TraMADol HCl 100 MG TB24 3 times  a day    . triamcinolone cream (KENALOG) 0.1 % Apply topically.     No current facility-administered medications on file prior to visit.    Allergies  Allergen Reactions  . Amlodipine Swelling  . Clonidine Other (See Comments)    Objective: Physical Exam  General: Well developed, nourished, no acute distress, awake, alert and oriented x 3  Vascular: Dorsalis pedis artery 1/4 bilateral, Posterior tibial artery 1/4 bilateral, skin temperature warm to warm proximal to distal bilateral lower extremities, mild varicosities, pedal hair present bilateral.  Neurological: Gross sensation present via light touch bilateral.   Dermatological: Skin is warm, dry, and supple bilateral, Nails 1-10 are tender, long, thick, and discolored with mild subungal debris, no webspace macerations present bilateral, no open lesions present bilateral, no callus/corns/hyperkeratotic tissue present  bilateral.  No signs of infection.  Musculoskeletal: Asymptomatic pes planus boney deformities noted bilateral. Muscular strength within normal limits without painon range of motion. No pain with calf compression bilateral.  Assessment and Plan:  Problem List Items Addressed This Visit    None    Visit Diagnoses    Pain due to onychomycosis of toenail    -  Primary     -Examined patient.  -Re-Discussed treatment options for painful mycotic nails. -Mechanically debrided and reduced mycotic nails with sterile nail nipper and dremel nail file. -Encouraged daily skin emollients like before -Return to office in 3 months or sooner if problems or issues arise  Asencion Islam, DPM

## 2020-02-29 DIAGNOSIS — N200 Calculus of kidney: Secondary | ICD-10-CM | POA: Diagnosis not present

## 2020-02-29 DIAGNOSIS — R109 Unspecified abdominal pain: Secondary | ICD-10-CM | POA: Diagnosis not present

## 2020-03-23 ENCOUNTER — Ambulatory Visit: Payer: No Typology Code available for payment source | Admitting: Sports Medicine

## 2020-03-29 ENCOUNTER — Other Ambulatory Visit: Payer: Self-pay

## 2020-03-29 ENCOUNTER — Ambulatory Visit (INDEPENDENT_AMBULATORY_CARE_PROVIDER_SITE_OTHER): Payer: Medicare HMO | Admitting: Sports Medicine

## 2020-03-29 ENCOUNTER — Encounter: Payer: Self-pay | Admitting: Sports Medicine

## 2020-03-29 DIAGNOSIS — M79676 Pain in unspecified toe(s): Secondary | ICD-10-CM | POA: Diagnosis not present

## 2020-03-29 DIAGNOSIS — B351 Tinea unguium: Secondary | ICD-10-CM

## 2020-03-29 NOTE — Progress Notes (Signed)
Subjective: Brian Jennings is a 86 y.o. male patient seen today in office with complaint of painful thickened and elongated toenails; unable to trim. Patient denies any changes in health or medications since last encounter.  No other pedal complaints noted.  [facility] patient.   Patient Active Problem List   Diagnosis Date Noted  . DDD (degenerative disc disease), lumbar 06/05/2018  . Sacroiliac inflammation (HCC) 03/11/2018  . Cervical radiculopathy 12/09/2017  . Allergic rhinitis 10/30/2013  . Dyspnea 10/20/2013    Current Outpatient Medications on File Prior to Visit  Medication Sig Dispense Refill  . venlafaxine (EFFEXOR) 75 MG tablet TAKE TWO TABLETS BY MOUTH DAILY FOR DEPRESSION AND ANXIETY    . acetaminophen (TYLENOL) 500 MG tablet Take by mouth.    Marland Kitchen acyclovir (ZOVIRAX) 800 MG tablet Take by mouth.    Marland Kitchen aspirin 81 MG tablet Take 81 mg by mouth daily.    . carvedilol (COREG) 25 MG tablet Take by mouth.    . cetirizine (ZYRTEC) 10 MG tablet Take 10 mg by mouth daily.    . Cholecalciferol 2000 UNITS CAPS Take 1 capsule by mouth daily.    . ciclopirox (PENLAC) 8 % solution     . citalopram (CELEXA) 40 MG tablet Take 40 mg by mouth daily as needed.    . diclofenac sodium (VOLTAREN) 1 % GEL Apply topically.    . diphenhydrAMINE (BENADRYL) 50 MG capsule Take by mouth.    . donepezil (ARICEPT) 10 MG tablet Take 10 mg by mouth at bedtime.    Marland Kitchen doxazosin (CARDURA) 8 MG tablet Take by mouth.    . famciclovir (FAMVIR) 500 MG tablet Take 500 mg by mouth 3 (three) times daily.    . finasteride (PROSCAR) 5 MG tablet Take by mouth.    . fluticasone (FLONASE) 50 MCG/ACT nasal spray Place 2 sprays into both nostrils 2 (two) times daily. 16 g 11  . gabapentin (NEURONTIN) 300 MG capsule     . hydrALAZINE (APRESOLINE) 100 MG tablet Take by mouth.    . isosorbide mononitrate (IMDUR) 30 MG 24 hr tablet Take by mouth.    . labetalol (NORMODYNE) 100 MG tablet Take 100 mg by mouth 2 (two) times daily.     Marland Kitchen levothyroxine (SYNTHROID, LEVOTHROID) 50 MCG tablet Take 50 mcg by mouth daily before breakfast.    . lidocaine (LIDODERM) 5 % 2 times a day    . lisinopril (PRINIVIL,ZESTRIL) 40 MG tablet Take 40 mg by mouth daily.    Marland Kitchen LORazepam (ATIVAN) 0.5 MG tablet Take by mouth.    . MENTHOL-METHYL SALICYLATE EX Apply topically 4 (four) times daily.    . methocarbamol (ROBAXIN) 750 MG tablet 4 times a day    . mirabegron ER (MYRBETRIQ) 25 MG TB24 tablet Take 25 mg by mouth daily.    Marland Kitchen morphine (MSIR) 15 MG tablet 2 times a day as needed    . mupirocin ointment (BACTROBAN) 2 % APPLY OINTMENT TO AFFECTED AREA(S) THREE TIMES DAILY FOR 7 10 DAYS    . potassium chloride (MICRO-K) 10 MEQ CR capsule Take 10 mEq by mouth 2 (two) times daily.    . pravastatin (PRAVACHOL) 40 MG tablet Take 40 mg by mouth daily.    . pregabalin (LYRICA) 50 MG capsule 3 times a day    . QUEtiapine (SEROQUEL) 25 MG tablet Take 25 mg by mouth as needed (insomnia).    . ranitidine (ZANTAC) 150 MG capsule Take 150 mg by mouth 2 (two) times daily.    Marland Kitchen  sulfamethoxazole-trimethoprim (BACTRIM DS) 800-160 MG tablet Take 1 tablet by mouth 2 (two) times daily with a meal.    . TraMADol HCl 100 MG TB24 3 times a day    . triamcinolone cream (KENALOG) 0.1 % Apply topically.     No current facility-administered medications on file prior to visit.    Allergies  Allergen Reactions  . Amlodipine Swelling  . Clonidine Other (See Comments)    Objective: Physical Exam  General: Well developed, nourished, no acute distress, awake, alert and oriented x 3  Vascular: Dorsalis pedis artery 1/4 bilateral, Posterior tibial artery 1/4 bilateral, skin temperature warm to warm proximal to distal bilateral lower extremities, mild varicosities, pedal hair present bilateral.  Neurological: Gross sensation present via light touch bilateral.   Dermatological: Skin is warm, dry, and supple bilateral, Nails 1-10 are tender, long, thick, and discolored  with mild subungal debris, no webspace macerations present bilateral, no open lesions present bilateral, no callus/corns/hyperkeratotic tissue present bilateral.  No signs of infection.  Musculoskeletal: Asymptomatic pes planus boney deformities noted bilateral. Muscular strength within normal limits without painon range of motion. No pain with calf compression bilateral.  Assessment and Plan:  Problem List Items Addressed This Visit   None   Visit Diagnoses    Pain due to onychomycosis of toenail    -  Primary     -Examined patient.  -Re-Discussed treatment options for painful mycotic nails. -Mechanically debrided and reduced mycotic nails with sterile nail nipper and dremel nail file. -Encouraged daily skin emollients like before -Return to office in 3 months or sooner if problems or issues arise  Asencion Islam, DPM

## 2020-06-29 ENCOUNTER — Encounter: Payer: Self-pay | Admitting: Sports Medicine

## 2020-06-29 ENCOUNTER — Other Ambulatory Visit: Payer: Self-pay

## 2020-06-29 ENCOUNTER — Ambulatory Visit (INDEPENDENT_AMBULATORY_CARE_PROVIDER_SITE_OTHER): Payer: Medicare HMO | Admitting: Sports Medicine

## 2020-06-29 DIAGNOSIS — B351 Tinea unguium: Secondary | ICD-10-CM | POA: Diagnosis not present

## 2020-06-29 DIAGNOSIS — M79676 Pain in unspecified toe(s): Secondary | ICD-10-CM

## 2020-06-29 NOTE — Progress Notes (Signed)
Subjective: Brian Jennings is a 86 y.o. male patient seen today in office with complaint of painful thickened and elongated toenails; unable to trim. Patient denies any changes in health or medications since last encounter.  No other pedal complaints noted.  [facility] patient.  Patient is concerned why our office has to probably keep checking his authorization for each visit and reports that this should be done prior to him coming.  Patient Active Problem List   Diagnosis Date Noted  . DDD (degenerative disc disease), lumbar 06/05/2018  . Sacroiliac inflammation (HCC) 03/11/2018  . Cervical radiculopathy 12/09/2017  . Allergic rhinitis 10/30/2013  . Dyspnea 10/20/2013    Current Outpatient Medications on File Prior to Visit  Medication Sig Dispense Refill  . fluorouracil (EFUDEX) 5 % cream APPLY SMALL AMOUNT TO AFFECTED AREA AT BEDTIME START OCT OR JAN. APPLY 4-6  WEEKS AS TOLERATED; WASH, PAT DRY, DO NOT RUB; TREAT:  SCALP/FACE/EARS/NECK/ARMS; DO NOT APPLY NEAR EYES, NOSTRILS, MOUTH; WASH  HANDS  AFTERWARDS; APPLY MOISTURIZER EVERY 2 HOURS WHEN AWAKE; AVOID SUN. SEE  HANDOUT START OCT OR JAN. APPLY 4-6  WEEKS AS TOLERATED; WASH, PAT DRY, DO NOT RUB; TREAT:  SCALP/FACE/EARS/NECK/ARMS; DO NOT APPLY NEAR EYES, NOSTRILS, MOUTH; WASH   HANDS  AFTERWARDS; APPLY MOISTURIZER EVERY 2 HOURS WHEN AWAKE; AVOID SUN. SEE   HANDOUT    . ibuprofen (ADVIL) 400 MG tablet Take 1 tablet by mouth daily as needed.    . loratadine (CLARITIN) 10 MG tablet TAKE ONE TABLET BY MOUTH DAILY AS NEEDED FOR ALLERGY SYMPTOMS    . prazosin (MINIPRESS) 1 MG capsule TAKE ONE CAPSULE BY MOUTH AT BEDTIME FOR NIGHTMARES    . spironolactone (ALDACTONE) 25 MG tablet Take 0.5 tablets by mouth daily.    Marland Kitchen acetaminophen (TYLENOL) 500 MG tablet Take by mouth.    Marland Kitchen acyclovir (ZOVIRAX) 800 MG tablet Take by mouth.    Marland Kitchen aspirin 81 MG tablet Take 81 mg by mouth daily.    . carvedilol (COREG) 25 MG tablet Take by mouth.    . cetirizine (ZYRTEC)  10 MG tablet Take 10 mg by mouth daily.    . Cholecalciferol 2000 UNITS CAPS Take 1 capsule by mouth daily.    . ciclopirox (PENLAC) 8 % solution     . citalopram (CELEXA) 40 MG tablet Take 40 mg by mouth daily as needed.    . diclofenac sodium (VOLTAREN) 1 % GEL Apply topically.    . diphenhydrAMINE (BENADRYL) 50 MG capsule Take by mouth.    . donepezil (ARICEPT) 10 MG tablet Take 10 mg by mouth at bedtime.    Marland Kitchen doxazosin (CARDURA) 8 MG tablet Take by mouth.    . famciclovir (FAMVIR) 500 MG tablet Take 500 mg by mouth 3 (three) times daily.    . finasteride (PROSCAR) 5 MG tablet Take by mouth.    . fluticasone (FLONASE) 50 MCG/ACT nasal spray Place 2 sprays into both nostrils 2 (two) times daily. 16 g 11  . gabapentin (NEURONTIN) 300 MG capsule     . hydrALAZINE (APRESOLINE) 100 MG tablet Take by mouth.    . isosorbide mononitrate (IMDUR) 30 MG 24 hr tablet Take by mouth.    . labetalol (NORMODYNE) 100 MG tablet Take 100 mg by mouth 2 (two) times daily.    Marland Kitchen levothyroxine (SYNTHROID, LEVOTHROID) 50 MCG tablet Take 50 mcg by mouth daily before breakfast.    . lidocaine (LIDODERM) 5 % 2 times a day    . lisinopril (PRINIVIL,ZESTRIL)  40 MG tablet Take 40 mg by mouth daily.    Marland Kitchen LORazepam (ATIVAN) 0.5 MG tablet Take by mouth.    . MENTHOL-METHYL SALICYLATE EX Apply topically 4 (four) times daily.    . methocarbamol (ROBAXIN) 750 MG tablet 4 times a day    . mirabegron ER (MYRBETRIQ) 25 MG TB24 tablet Take 25 mg by mouth daily.    Marland Kitchen morphine (MSIR) 15 MG tablet 2 times a day as needed    . mupirocin ointment (BACTROBAN) 2 % APPLY OINTMENT TO AFFECTED AREA(S) THREE TIMES DAILY FOR 7 10 DAYS    . potassium chloride (MICRO-K) 10 MEQ CR capsule Take 10 mEq by mouth 2 (two) times daily.    . pravastatin (PRAVACHOL) 40 MG tablet Take 40 mg by mouth daily.    . pregabalin (LYRICA) 50 MG capsule 3 times a day    . QUEtiapine (SEROQUEL) 25 MG tablet Take 25 mg by mouth as needed (insomnia).    .  ranitidine (ZANTAC) 150 MG capsule Take 150 mg by mouth 2 (two) times daily.    Marland Kitchen sulfamethoxazole-trimethoprim (BACTRIM DS) 800-160 MG tablet Take 1 tablet by mouth 2 (two) times daily with a meal.    . TraMADol HCl 100 MG TB24 3 times a day    . triamcinolone cream (KENALOG) 0.1 % Apply topically.    . venlafaxine (EFFEXOR) 75 MG tablet TAKE TWO TABLETS BY MOUTH DAILY FOR DEPRESSION AND ANXIETY     No current facility-administered medications on file prior to visit.    Allergies  Allergen Reactions  . Amlodipine Swelling  . Clonidine Other (See Comments)  . No Known Allergies Swelling    Other reaction(s): Edema of lower extremity    Objective: Physical Exam  General: Well developed, nourished, no acute distress, awake, alert and oriented x 3  Vascular: Dorsalis pedis artery 1/4 bilateral, Posterior tibial artery 1/4 bilateral, skin temperature warm to warm proximal to distal bilateral lower extremities, mild varicosities, pedal hair present bilateral.  Neurological: Gross sensation present via light touch bilateral.   Dermatological: Skin is warm, dry, and supple bilateral, Nails 1-10 are tender, long, thick, and discolored with mild subungal debris, no webspace macerations present bilateral, no open lesions present bilateral, no callus/corns/hyperkeratotic tissue present bilateral.  No signs of infection.  Musculoskeletal: Asymptomatic pes planus boney deformities noted bilateral. Muscular strength within normal limits without painon range of motion. No pain with calf compression bilateral.  Assessment and Plan:  Problem List Items Addressed This Visit   None   Visit Diagnoses    Pain due to onychomycosis of toenail    -  Primary     -Examined patient.  -Re-Discussed treatment options for painful mycotic nails. -Mechanically debrided and reduced mycotic nails with sterile nail nipper and dremel nail file. -Encouraged patient to bring authorization paperwork with him each  visit to help with verification of [facility] authorization -Return to office in 2.5-3 months or sooner if problems or issues arise  Asencion Islam, DPM

## 2020-07-13 DIAGNOSIS — J189 Pneumonia, unspecified organism: Secondary | ICD-10-CM | POA: Diagnosis not present

## 2020-07-13 DIAGNOSIS — Z20828 Contact with and (suspected) exposure to other viral communicable diseases: Secondary | ICD-10-CM | POA: Diagnosis not present

## 2020-07-13 DIAGNOSIS — R509 Fever, unspecified: Secondary | ICD-10-CM | POA: Diagnosis not present

## 2020-07-13 DIAGNOSIS — I10 Essential (primary) hypertension: Secondary | ICD-10-CM | POA: Diagnosis not present

## 2020-07-13 DIAGNOSIS — I700 Atherosclerosis of aorta: Secondary | ICD-10-CM | POA: Diagnosis not present

## 2020-07-13 DIAGNOSIS — R051 Acute cough: Secondary | ICD-10-CM | POA: Diagnosis not present

## 2020-07-13 DIAGNOSIS — K449 Diaphragmatic hernia without obstruction or gangrene: Secondary | ICD-10-CM | POA: Diagnosis not present

## 2020-07-13 DIAGNOSIS — R0981 Nasal congestion: Secondary | ICD-10-CM | POA: Diagnosis not present

## 2020-07-13 DIAGNOSIS — J9811 Atelectasis: Secondary | ICD-10-CM | POA: Diagnosis not present

## 2020-07-13 DIAGNOSIS — J9601 Acute respiratory failure with hypoxia: Secondary | ICD-10-CM | POA: Diagnosis not present

## 2020-07-13 DIAGNOSIS — I517 Cardiomegaly: Secondary | ICD-10-CM | POA: Diagnosis not present

## 2020-07-13 DIAGNOSIS — R0602 Shortness of breath: Secondary | ICD-10-CM | POA: Diagnosis not present

## 2020-07-14 DIAGNOSIS — J9601 Acute respiratory failure with hypoxia: Secondary | ICD-10-CM | POA: Diagnosis not present

## 2020-07-14 DIAGNOSIS — J189 Pneumonia, unspecified organism: Secondary | ICD-10-CM | POA: Diagnosis not present

## 2020-07-14 DIAGNOSIS — I10 Essential (primary) hypertension: Secondary | ICD-10-CM | POA: Diagnosis not present

## 2020-09-16 ENCOUNTER — Ambulatory Visit: Payer: No Typology Code available for payment source | Admitting: Sports Medicine

## 2020-09-20 ENCOUNTER — Encounter: Payer: Self-pay | Admitting: Sports Medicine

## 2020-09-20 ENCOUNTER — Ambulatory Visit (INDEPENDENT_AMBULATORY_CARE_PROVIDER_SITE_OTHER): Payer: Medicare HMO | Admitting: Sports Medicine

## 2020-09-20 ENCOUNTER — Other Ambulatory Visit: Payer: Self-pay

## 2020-09-20 DIAGNOSIS — M79676 Pain in unspecified toe(s): Secondary | ICD-10-CM

## 2020-09-20 DIAGNOSIS — B351 Tinea unguium: Secondary | ICD-10-CM | POA: Diagnosis not present

## 2020-09-20 NOTE — Progress Notes (Signed)
Subjective: Brian Jennings is a 86 y.o. male patient seen today in office with complaint of painful thickened and elongated toenails; unable to trim. Reports that he wants his nails cut short. Patient denies any changes in health or medications since last encounter.  No other pedal complaints noted.  Patient Active Problem List   Diagnosis Date Noted   DDD (degenerative disc disease), lumbar 06/05/2018   Sacroiliac inflammation (HCC) 03/11/2018   Cervical radiculopathy 12/09/2017   Allergic rhinitis 10/30/2013   Dyspnea 10/20/2013    Current Outpatient Medications on File Prior to Visit  Medication Sig Dispense Refill   acetaminophen (TYLENOL) 500 MG tablet Take by mouth.     acyclovir (ZOVIRAX) 800 MG tablet Take by mouth.     aspirin 81 MG tablet Take 81 mg by mouth daily.     carvedilol (COREG) 25 MG tablet Take by mouth.     cetirizine (ZYRTEC) 10 MG tablet Take 10 mg by mouth daily.     Cholecalciferol 2000 UNITS CAPS Take 1 capsule by mouth daily.     ciclopirox (PENLAC) 8 % solution      citalopram (CELEXA) 40 MG tablet Take 40 mg by mouth daily as needed.     diclofenac sodium (VOLTAREN) 1 % GEL Apply topically.     diphenhydrAMINE (BENADRYL) 50 MG capsule Take by mouth.     donepezil (ARICEPT) 10 MG tablet Take 10 mg by mouth at bedtime.     doxazosin (CARDURA) 8 MG tablet Take by mouth.     famciclovir (FAMVIR) 500 MG tablet Take 500 mg by mouth 3 (three) times daily.     finasteride (PROSCAR) 5 MG tablet Take by mouth.     fluorouracil (EFUDEX) 5 % cream APPLY SMALL AMOUNT TO AFFECTED AREA AT BEDTIME START OCT OR JAN. APPLY 4-6  WEEKS AS TOLERATED; WASH, PAT DRY, DO NOT RUB; TREAT:  SCALP/FACE/EARS/NECK/ARMS; DO NOT APPLY NEAR EYES, NOSTRILS, MOUTH; WASH  HANDS  AFTERWARDS; APPLY MOISTURIZER EVERY 2 HOURS WHEN AWAKE; AVOID SUN. SEE  HANDOUT START OCT OR JAN. APPLY 4-6  WEEKS AS TOLERATED; WASH, PAT DRY, DO NOT RUB; TREAT:  SCALP/FACE/EARS/NECK/ARMS; DO NOT APPLY NEAR EYES,  NOSTRILS, MOUTH; WASH   HANDS  AFTERWARDS; APPLY MOISTURIZER EVERY 2 HOURS WHEN AWAKE; AVOID SUN. SEE   HANDOUT     fluticasone (FLONASE) 50 MCG/ACT nasal spray Place 2 sprays into both nostrils 2 (two) times daily. 16 g 11   gabapentin (NEURONTIN) 300 MG capsule      hydrALAZINE (APRESOLINE) 100 MG tablet Take by mouth.     ibuprofen (ADVIL) 400 MG tablet Take 1 tablet by mouth daily as needed.     isosorbide mononitrate (IMDUR) 30 MG 24 hr tablet Take by mouth.     labetalol (NORMODYNE) 100 MG tablet Take 100 mg by mouth 2 (two) times daily.     levothyroxine (SYNTHROID, LEVOTHROID) 50 MCG tablet Take 50 mcg by mouth daily before breakfast.     lidocaine (LIDODERM) 5 % 2 times a day     lisinopril (PRINIVIL,ZESTRIL) 40 MG tablet Take 40 mg by mouth daily.     loratadine (CLARITIN) 10 MG tablet TAKE ONE TABLET BY MOUTH DAILY AS NEEDED FOR ALLERGY SYMPTOMS     LORazepam (ATIVAN) 0.5 MG tablet Take by mouth.     MENTHOL-METHYL SALICYLATE EX Apply topically 4 (four) times daily.     methocarbamol (ROBAXIN) 750 MG tablet 4 times a day     mirabegron ER (MYRBETRIQ) 25 MG  TB24 tablet Take 25 mg by mouth daily.     morphine (MSIR) 15 MG tablet 2 times a day as needed     mupirocin ointment (BACTROBAN) 2 % APPLY OINTMENT TO AFFECTED AREA(S) THREE TIMES DAILY FOR 7 10 DAYS     potassium chloride (MICRO-K) 10 MEQ CR capsule Take 10 mEq by mouth 2 (two) times daily.     pravastatin (PRAVACHOL) 40 MG tablet Take 40 mg by mouth daily.     prazosin (MINIPRESS) 1 MG capsule TAKE ONE CAPSULE BY MOUTH AT BEDTIME FOR NIGHTMARES     pregabalin (LYRICA) 50 MG capsule 3 times a day     QUEtiapine (SEROQUEL) 25 MG tablet Take 25 mg by mouth as needed (insomnia).     ranitidine (ZANTAC) 150 MG capsule Take 150 mg by mouth 2 (two) times daily.     spironolactone (ALDACTONE) 25 MG tablet Take 0.5 tablets by mouth daily.     sulfamethoxazole-trimethoprim (BACTRIM DS) 800-160 MG tablet Take 1 tablet by mouth 2 (two)  times daily with a meal.     TraMADol HCl 100 MG TB24 3 times a day     triamcinolone cream (KENALOG) 0.1 % Apply topically.     venlafaxine (EFFEXOR) 75 MG tablet TAKE TWO TABLETS BY MOUTH DAILY FOR DEPRESSION AND ANXIETY     No current facility-administered medications on file prior to visit.    Allergies  Allergen Reactions   Amlodipine Swelling   Clonidine Other (See Comments)   No Known Allergies Swelling    Other reaction(s): Edema of lower extremity    Objective: Physical Exam  General: Well developed, nourished, no acute distress, awake, alert and oriented x 3  Vascular: Dorsalis pedis artery 1/4 bilateral, Posterior tibial artery 1/4 bilateral, skin temperature warm to warm proximal to distal bilateral lower extremities, mild varicosities, pedal hair present bilateral.  Neurological: Gross sensation present via light touch bilateral.   Dermatological: Skin is warm, dry, and supple bilateral, Nails 1-10 are tender, long, thick, and discolored with mild subungal debris, no webspace macerations present bilateral, no open lesions present bilateral, no callus/corns/hyperkeratotic tissue present bilateral.  No signs of infection.  Musculoskeletal: Asymptomatic pes planus boney deformities noted bilateral. Muscular strength within normal limits without painon range of motion. No pain with calf compression bilateral.  Assessment and Plan:  Problem List Items Addressed This Visit   None Visit Diagnoses     Pain due to onychomycosis of toenail    -  Primary      -Examined patient.  -Re-Discussed treatment options for painful mycotic nails. -Mechanically debrided and reduced mycotic nails with sterile nail nipper and dremel nail file. -Return to office in 2.5-3 months or sooner if problems or issues arise  Asencion Islam, DPM

## 2020-12-21 ENCOUNTER — Ambulatory Visit (INDEPENDENT_AMBULATORY_CARE_PROVIDER_SITE_OTHER): Payer: No Typology Code available for payment source | Admitting: Sports Medicine

## 2020-12-21 ENCOUNTER — Encounter: Payer: Self-pay | Admitting: Sports Medicine

## 2020-12-21 DIAGNOSIS — M79676 Pain in unspecified toe(s): Secondary | ICD-10-CM

## 2020-12-21 DIAGNOSIS — B351 Tinea unguium: Secondary | ICD-10-CM

## 2020-12-21 NOTE — Progress Notes (Signed)
Subjective: Brian Jennings is a 86 y.o. male patient seen today in office with complaint of painful thickened and elongated toenails; unable to trim. Reports that he is still having issues with billing.  No other concerns noted at this visit.  Patient Active Problem List   Diagnosis Date Noted   DDD (degenerative disc disease), lumbar 06/05/2018   Sacroiliac inflammation (HCC) 03/11/2018   Cervical radiculopathy 12/09/2017   Allergic rhinitis 10/30/2013   Dyspnea 10/20/2013    Current Outpatient Medications on File Prior to Visit  Medication Sig Dispense Refill   acetaminophen (TYLENOL) 500 MG tablet Take by mouth.     acyclovir (ZOVIRAX) 800 MG tablet Take by mouth.     aspirin 81 MG tablet Take 81 mg by mouth daily.     carvedilol (COREG) 25 MG tablet Take by mouth.     cetirizine (ZYRTEC) 10 MG tablet Take 10 mg by mouth daily.     Cholecalciferol 2000 UNITS CAPS Take 1 capsule by mouth daily.     ciclopirox (PENLAC) 8 % solution      citalopram (CELEXA) 40 MG tablet Take 40 mg by mouth daily as needed.     diclofenac sodium (VOLTAREN) 1 % GEL Apply topically.     diphenhydrAMINE (BENADRYL) 50 MG capsule Take by mouth.     donepezil (ARICEPT) 10 MG tablet Take 10 mg by mouth at bedtime.     doxazosin (CARDURA) 8 MG tablet Take by mouth.     famciclovir (FAMVIR) 500 MG tablet Take 500 mg by mouth 3 (three) times daily.     finasteride (PROSCAR) 5 MG tablet Take by mouth.     fluorouracil (EFUDEX) 5 % cream APPLY SMALL AMOUNT TO AFFECTED AREA AT BEDTIME START OCT OR JAN. APPLY 4-6  WEEKS AS TOLERATED; WASH, PAT DRY, DO NOT RUB; TREAT:  SCALP/FACE/EARS/NECK/ARMS; DO NOT APPLY NEAR EYES, NOSTRILS, MOUTH; WASH  HANDS  AFTERWARDS; APPLY MOISTURIZER EVERY 2 HOURS WHEN AWAKE; AVOID SUN. SEE  HANDOUT START OCT OR JAN. APPLY 4-6  WEEKS AS TOLERATED; WASH, PAT DRY, DO NOT RUB; TREAT:  SCALP/FACE/EARS/NECK/ARMS; DO NOT APPLY NEAR EYES, NOSTRILS, MOUTH; WASH   HANDS  AFTERWARDS; APPLY MOISTURIZER  EVERY 2 HOURS WHEN AWAKE; AVOID SUN. SEE   HANDOUT     fluticasone (FLONASE) 50 MCG/ACT nasal spray Place 2 sprays into both nostrils 2 (two) times daily. 16 g 11   gabapentin (NEURONTIN) 300 MG capsule      hydrALAZINE (APRESOLINE) 100 MG tablet Take by mouth.     ibuprofen (ADVIL) 400 MG tablet Take 1 tablet by mouth daily as needed.     isosorbide mononitrate (IMDUR) 30 MG 24 hr tablet Take by mouth.     labetalol (NORMODYNE) 100 MG tablet Take 100 mg by mouth 2 (two) times daily.     levothyroxine (SYNTHROID, LEVOTHROID) 50 MCG tablet Take 50 mcg by mouth daily before breakfast.     lidocaine (LIDODERM) 5 % 2 times a day     lisinopril (PRINIVIL,ZESTRIL) 40 MG tablet Take 40 mg by mouth daily.     loratadine (CLARITIN) 10 MG tablet TAKE ONE TABLET BY MOUTH DAILY AS NEEDED FOR ALLERGY SYMPTOMS     LORazepam (ATIVAN) 0.5 MG tablet Take by mouth.     MENTHOL-METHYL SALICYLATE EX Apply topically 4 (four) times daily.     methocarbamol (ROBAXIN) 750 MG tablet 4 times a day     mirabegron ER (MYRBETRIQ) 25 MG TB24 tablet Take 25 mg by mouth daily.  morphine (MSIR) 15 MG tablet 2 times a day as needed     mupirocin ointment (BACTROBAN) 2 % APPLY OINTMENT TO AFFECTED AREA(S) THREE TIMES DAILY FOR 7 10 DAYS     potassium chloride (MICRO-K) 10 MEQ CR capsule Take 10 mEq by mouth 2 (two) times daily.     pravastatin (PRAVACHOL) 40 MG tablet Take 40 mg by mouth daily.     prazosin (MINIPRESS) 1 MG capsule TAKE ONE CAPSULE BY MOUTH AT BEDTIME FOR NIGHTMARES     pregabalin (LYRICA) 50 MG capsule 3 times a day     QUEtiapine (SEROQUEL) 25 MG tablet Take 25 mg by mouth as needed (insomnia).     ranitidine (ZANTAC) 150 MG capsule Take 150 mg by mouth 2 (two) times daily.     spironolactone (ALDACTONE) 25 MG tablet Take 0.5 tablets by mouth daily.     sulfamethoxazole-trimethoprim (BACTRIM DS) 800-160 MG tablet Take 1 tablet by mouth 2 (two) times daily with a meal.     TraMADol HCl 100 MG TB24 3 times  a day     triamcinolone cream (KENALOG) 0.1 % Apply topically.     venlafaxine (EFFEXOR) 75 MG tablet TAKE TWO TABLETS BY MOUTH DAILY FOR DEPRESSION AND ANXIETY     No current facility-administered medications on file prior to visit.    Allergies  Allergen Reactions   Sinemet [Carbidopa-Levodopa] Other (See Comments)   Amlodipine Swelling   Clonidine Other (See Comments)   No Known Allergies Swelling    Other reaction(s): Edema of lower extremity    Objective: Physical Exam  General: Well developed, nourished, no acute distress, awake, alert and oriented x 3  Vascular: Dorsalis pedis artery 1/4 bilateral, Posterior tibial artery 1/4 bilateral, skin temperature warm to warm proximal to distal bilateral lower extremities, mild varicosities, pedal hair present bilateral.  Neurological: Gross sensation present via light touch bilateral.   Dermatological: Skin is warm, dry, and supple bilateral, Nails 1-10 are tender, long, thick, and discolored with mild subungal debris, no webspace macerations present bilateral, no open lesions present bilateral, no callus/corns/hyperkeratotic tissue present bilateral.  No signs of infection.  Musculoskeletal: Asymptomatic pes planus boney deformities noted bilateral. Muscular strength within normal limits without painon range of motion. No pain with calf compression bilateral.  Assessment and Plan:  Problem List Items Addressed This Visit   None Visit Diagnoses     Pain due to onychomycosis of toenail    -  Primary      -Examined patient.  -Re-Discussed treatment options for painful mycotic nails. -Mechanically debrided and reduced mycotic nails with sterile nail nipper and dremel nail file. -We will have Chip Boer from billing office to assist patients with his billing concerns -Return to office in 2.5-3 months or sooner if problems or issues arise  Asencion Islam, DPM

## 2021-03-28 ENCOUNTER — Ambulatory Visit (INDEPENDENT_AMBULATORY_CARE_PROVIDER_SITE_OTHER): Payer: No Typology Code available for payment source | Admitting: Sports Medicine

## 2021-03-28 DIAGNOSIS — B351 Tinea unguium: Secondary | ICD-10-CM

## 2021-03-28 DIAGNOSIS — M79676 Pain in unspecified toe(s): Secondary | ICD-10-CM

## 2021-03-29 ENCOUNTER — Encounter: Payer: Self-pay | Admitting: Sports Medicine

## 2021-03-29 NOTE — Progress Notes (Signed)
Subjective: Dina Mobley is a 87 y.o. male patient seen today in office with complaint of painful thickened and elongated toenails; unable to trim. Reports that he is doing ok still struggling with Parkinsons.  No other concerns noted at this visit.  Patient Active Problem List   Diagnosis Date Noted   DDD (degenerative disc disease), lumbar 06/05/2018   Sacroiliac inflammation (HCC) 03/11/2018   Cervical radiculopathy 12/09/2017   Allergic rhinitis 10/30/2013   Dyspnea 10/20/2013    Current Outpatient Medications on File Prior to Visit  Medication Sig Dispense Refill   acetaminophen (TYLENOL) 500 MG tablet Take by mouth.     acyclovir (ZOVIRAX) 800 MG tablet Take by mouth.     aspirin 81 MG tablet Take 81 mg by mouth daily.     carvedilol (COREG) 25 MG tablet Take by mouth.     cetirizine (ZYRTEC) 10 MG tablet Take 10 mg by mouth daily.     Cholecalciferol 2000 UNITS CAPS Take 1 capsule by mouth daily.     ciclopirox (PENLAC) 8 % solution      citalopram (CELEXA) 40 MG tablet Take 40 mg by mouth daily as needed.     diclofenac sodium (VOLTAREN) 1 % GEL Apply topically.     diphenhydrAMINE (BENADRYL) 50 MG capsule Take by mouth.     donepezil (ARICEPT) 10 MG tablet Take 10 mg by mouth at bedtime.     doxazosin (CARDURA) 8 MG tablet Take by mouth.     famciclovir (FAMVIR) 500 MG tablet Take 500 mg by mouth 3 (three) times daily.     finasteride (PROSCAR) 5 MG tablet Take by mouth.     fluorouracil (EFUDEX) 5 % cream APPLY SMALL AMOUNT TO AFFECTED AREA AT BEDTIME START OCT OR JAN. APPLY 4-6  WEEKS AS TOLERATED; WASH, PAT DRY, DO NOT RUB; TREAT:  SCALP/FACE/EARS/NECK/ARMS; DO NOT APPLY NEAR EYES, NOSTRILS, MOUTH; WASH  HANDS  AFTERWARDS; APPLY MOISTURIZER EVERY 2 HOURS WHEN AWAKE; AVOID SUN. SEE  HANDOUT START OCT OR JAN. APPLY 4-6  WEEKS AS TOLERATED; WASH, PAT DRY, DO NOT RUB; TREAT:  SCALP/FACE/EARS/NECK/ARMS; DO NOT APPLY NEAR EYES, NOSTRILS, MOUTH; WASH   HANDS  AFTERWARDS; APPLY  MOISTURIZER EVERY 2 HOURS WHEN AWAKE; AVOID SUN. SEE   HANDOUT     fluticasone (FLONASE) 50 MCG/ACT nasal spray Place 2 sprays into both nostrils 2 (two) times daily. 16 g 11   gabapentin (NEURONTIN) 300 MG capsule      hydrALAZINE (APRESOLINE) 100 MG tablet Take by mouth.     ibuprofen (ADVIL) 400 MG tablet Take 1 tablet by mouth daily as needed.     isosorbide mononitrate (IMDUR) 30 MG 24 hr tablet Take by mouth.     labetalol (NORMODYNE) 100 MG tablet Take 100 mg by mouth 2 (two) times daily.     levothyroxine (SYNTHROID, LEVOTHROID) 50 MCG tablet Take 50 mcg by mouth daily before breakfast.     lidocaine (LIDODERM) 5 % 2 times a day     lisinopril (PRINIVIL,ZESTRIL) 40 MG tablet Take 40 mg by mouth daily.     loratadine (CLARITIN) 10 MG tablet TAKE ONE TABLET BY MOUTH DAILY AS NEEDED FOR ALLERGY SYMPTOMS     LORazepam (ATIVAN) 0.5 MG tablet Take by mouth.     MENTHOL-METHYL SALICYLATE EX Apply topically 4 (four) times daily.     methocarbamol (ROBAXIN) 750 MG tablet 4 times a day     mirabegron ER (MYRBETRIQ) 25 MG TB24 tablet Take 25 mg by mouth  daily.     morphine (MSIR) 15 MG tablet 2 times a day as needed     mupirocin ointment (BACTROBAN) 2 % APPLY OINTMENT TO AFFECTED AREA(S) THREE TIMES DAILY FOR 7 10 DAYS     potassium chloride (MICRO-K) 10 MEQ CR capsule Take 10 mEq by mouth 2 (two) times daily.     pravastatin (PRAVACHOL) 40 MG tablet Take 40 mg by mouth daily.     prazosin (MINIPRESS) 1 MG capsule TAKE ONE CAPSULE BY MOUTH AT BEDTIME FOR NIGHTMARES     pregabalin (LYRICA) 50 MG capsule 3 times a day     QUEtiapine (SEROQUEL) 25 MG tablet Take 25 mg by mouth as needed (insomnia).     ranitidine (ZANTAC) 150 MG capsule Take 150 mg by mouth 2 (two) times daily.     spironolactone (ALDACTONE) 25 MG tablet Take 0.5 tablets by mouth daily.     sulfamethoxazole-trimethoprim (BACTRIM DS) 800-160 MG tablet Take 1 tablet by mouth 2 (two) times daily with a meal.     TraMADol HCl 100 MG  TB24 3 times a day     triamcinolone cream (KENALOG) 0.1 % Apply topically.     venlafaxine (EFFEXOR) 75 MG tablet TAKE TWO TABLETS BY MOUTH DAILY FOR DEPRESSION AND ANXIETY     No current facility-administered medications on file prior to visit.    Allergies  Allergen Reactions   Sinemet [Carbidopa-Levodopa] Other (See Comments)   Amlodipine Swelling   Clonidine Other (See Comments)   No Known Allergies Swelling    Other reaction(s): Edema of lower extremity    Objective: Physical Exam  General: Well developed, nourished, no acute distress, awake, alert and oriented x 3  Vascular: Dorsalis pedis artery 1/4 bilateral, Posterior tibial artery 1/4 bilateral, skin temperature warm to warm proximal to distal bilateral lower extremities, mild varicosities, pedal hair present bilateral.  Neurological: Gross sensation present via light touch bilateral.   Dermatological: Skin is warm, dry, and supple bilateral, Nails 1-10 are tender, long, thick, and discolored with mild subungal debris, no webspace macerations present bilateral, no open lesions present bilateral, no callus/corns/hyperkeratotic tissue present bilateral.  No signs of infection.  Musculoskeletal: Asymptomatic pes planus boney deformities noted bilateral. Muscular strength within normal limits without painon range of motion. No pain with calf compression bilateral. Tremor secondary to parkinsons.   Assessment and Plan:  Problem List Items Addressed This Visit   None Visit Diagnoses     Pain due to onychomycosis of toenail    -  Primary      -Examined patient.  -Re-Discussed treatment options for painful mycotic nails. -Mechanically debrided and reduced mycotic nails with sterile nail nipper and dremel nail file. -Continue with medical mgt for parkinsons  -Return to office in 2.5-3 months or sooner if problems or issues arise  Asencion Islam, DPM

## 2021-06-16 ENCOUNTER — Encounter: Payer: Self-pay | Admitting: Sports Medicine

## 2021-06-16 ENCOUNTER — Ambulatory Visit (INDEPENDENT_AMBULATORY_CARE_PROVIDER_SITE_OTHER): Payer: No Typology Code available for payment source | Admitting: Sports Medicine

## 2021-06-16 DIAGNOSIS — H903 Sensorineural hearing loss, bilateral: Secondary | ICD-10-CM | POA: Insufficient documentation

## 2021-06-16 DIAGNOSIS — F339 Major depressive disorder, recurrent, unspecified: Secondary | ICD-10-CM | POA: Insufficient documentation

## 2021-06-16 DIAGNOSIS — Z7689 Persons encountering health services in other specified circumstances: Secondary | ICD-10-CM | POA: Insufficient documentation

## 2021-06-16 DIAGNOSIS — L738 Other specified follicular disorders: Secondary | ICD-10-CM | POA: Insufficient documentation

## 2021-06-16 DIAGNOSIS — L209 Atopic dermatitis, unspecified: Secondary | ICD-10-CM | POA: Insufficient documentation

## 2021-06-16 DIAGNOSIS — R251 Tremor, unspecified: Secondary | ICD-10-CM | POA: Insufficient documentation

## 2021-06-16 DIAGNOSIS — G250 Essential tremor: Secondary | ICD-10-CM | POA: Insufficient documentation

## 2021-06-16 DIAGNOSIS — D649 Anemia, unspecified: Secondary | ICD-10-CM | POA: Insufficient documentation

## 2021-06-16 DIAGNOSIS — Z85828 Personal history of other malignant neoplasm of skin: Secondary | ICD-10-CM | POA: Insufficient documentation

## 2021-06-16 DIAGNOSIS — E785 Hyperlipidemia, unspecified: Secondary | ICD-10-CM | POA: Insufficient documentation

## 2021-06-16 DIAGNOSIS — L281 Prurigo nodularis: Secondary | ICD-10-CM | POA: Insufficient documentation

## 2021-06-16 DIAGNOSIS — Z961 Presence of intraocular lens: Secondary | ICD-10-CM | POA: Insufficient documentation

## 2021-06-16 DIAGNOSIS — L570 Actinic keratosis: Secondary | ICD-10-CM | POA: Insufficient documentation

## 2021-06-16 DIAGNOSIS — E7800 Pure hypercholesterolemia, unspecified: Secondary | ICD-10-CM | POA: Insufficient documentation

## 2021-06-16 DIAGNOSIS — Z461 Encounter for fitting and adjustment of hearing aid: Secondary | ICD-10-CM | POA: Insufficient documentation

## 2021-06-16 DIAGNOSIS — A6000 Herpesviral infection of urogenital system, unspecified: Secondary | ICD-10-CM | POA: Insufficient documentation

## 2021-06-16 DIAGNOSIS — E039 Hypothyroidism, unspecified: Secondary | ICD-10-CM | POA: Insufficient documentation

## 2021-06-16 DIAGNOSIS — Z0289 Encounter for other administrative examinations: Secondary | ICD-10-CM | POA: Insufficient documentation

## 2021-06-16 DIAGNOSIS — F09 Unspecified mental disorder due to known physiological condition: Secondary | ICD-10-CM | POA: Insufficient documentation

## 2021-06-16 DIAGNOSIS — M1990 Unspecified osteoarthritis, unspecified site: Secondary | ICD-10-CM | POA: Insufficient documentation

## 2021-06-16 DIAGNOSIS — N400 Enlarged prostate without lower urinary tract symptoms: Secondary | ICD-10-CM | POA: Insufficient documentation

## 2021-06-16 DIAGNOSIS — M545 Low back pain, unspecified: Secondary | ICD-10-CM | POA: Insufficient documentation

## 2021-06-16 DIAGNOSIS — F330 Major depressive disorder, recurrent, mild: Secondary | ICD-10-CM | POA: Insufficient documentation

## 2021-06-16 DIAGNOSIS — I10 Essential (primary) hypertension: Secondary | ICD-10-CM | POA: Insufficient documentation

## 2021-06-16 DIAGNOSIS — Q8503 Schwannomatosis: Secondary | ICD-10-CM | POA: Insufficient documentation

## 2021-06-16 DIAGNOSIS — M79676 Pain in unspecified toe(s): Secondary | ICD-10-CM

## 2021-06-16 DIAGNOSIS — B351 Tinea unguium: Secondary | ICD-10-CM

## 2021-06-16 DIAGNOSIS — Z8669 Personal history of other diseases of the nervous system and sense organs: Secondary | ICD-10-CM

## 2021-06-16 DIAGNOSIS — J984 Other disorders of lung: Secondary | ICD-10-CM | POA: Insufficient documentation

## 2021-06-16 DIAGNOSIS — F411 Generalized anxiety disorder: Secondary | ICD-10-CM | POA: Insufficient documentation

## 2021-06-16 DIAGNOSIS — R350 Frequency of micturition: Secondary | ICD-10-CM | POA: Insufficient documentation

## 2021-06-16 DIAGNOSIS — I712 Thoracic aortic aneurysm, without rupture, unspecified: Secondary | ICD-10-CM | POA: Insufficient documentation

## 2021-06-16 DIAGNOSIS — G4730 Sleep apnea, unspecified: Secondary | ICD-10-CM | POA: Insufficient documentation

## 2021-06-16 DIAGNOSIS — N200 Calculus of kidney: Secondary | ICD-10-CM | POA: Insufficient documentation

## 2021-06-16 DIAGNOSIS — R800 Isolated proteinuria: Secondary | ICD-10-CM | POA: Insufficient documentation

## 2021-06-16 NOTE — Progress Notes (Signed)
?Subjective: ?Brian Jennings is a 87 y.o. male patient seen today in office with complaint of painful thickened and elongated toenails; unable to trim. Reports that he is doing ok still struggling with Parkinsons like before with excessive tremor/shaking of the hand on the right.  No other concerns noted at this visit. ? ?Patient Active Problem List  ? Diagnosis Date Noted  ? Actinic keratosis 06/16/2021  ? Anemia 06/16/2021  ? Arthritis 06/16/2021  ? Atopic dermatitis, unspecified 06/16/2021  ? Benign essential hypertension 06/16/2021  ? Hypertrophy of prostate without urinary obstruction and other lower urinary tract symptoms (LUTS) 06/16/2021  ? Calculus of kidney 06/16/2021  ? Encounter for fitting and adjustment of hearing aid 06/16/2021  ? Encounter for other administrative examinations 06/16/2021  ? Essential tremor 06/16/2021  ? Frequency of micturition 06/16/2021  ? Generalized anxiety disorder 06/16/2021  ? Genital herpes 06/16/2021  ? Hyperlipidemia, unspecified 06/16/2021  ? Hypothyroidism 06/16/2021  ? Isolated proteinuria 06/16/2021  ? Low back pain, unspecified 06/16/2021  ? Major depressive disorder, recurrent, mild (HCC) 06/16/2021  ? Mild cognitive disorder 06/16/2021  ? Morbid obesity (HCC) 06/16/2021  ? Other specified disease of sebaceous glands 06/16/2021  ? Sensorineural hearing loss, bilateral 06/16/2021  ? Personal history of other malignant neoplasm of skin 06/16/2021  ? Persons encountering health services in other specified circumstances 06/16/2021  ? Presence of intraocular lens 06/16/2021  ? Prurigo nodularis 06/16/2021  ? Pure hypercholesterolemia 06/16/2021  ? Recurrent major depression (HCC) 06/16/2021  ? Restrictive lung disease 06/16/2021  ? Schwannomatosis (HCC) 06/16/2021  ? Sleep apnea, unspecified 06/16/2021  ? Thoracic aortic aneurysm without rupture (HCC) 06/16/2021  ? Tremor, unspecified 06/16/2021  ? DDD (degenerative disc disease), lumbar 06/05/2018  ? Sacroiliac  inflammation (HCC) 03/11/2018  ? Cervical radiculopathy 12/09/2017  ? Allergic rhinitis 10/30/2013  ? Dyspnea 10/20/2013  ? ? ?Current Outpatient Medications on File Prior to Visit  ?Medication Sig Dispense Refill  ? famotidine (PEPCID) 40 MG tablet Take 1 tablet by mouth at bedtime.    ? hydrALAZINE (APRESOLINE) 100 MG tablet Take 1 tablet by mouth 3 (three) times daily.    ? hydrocortisone 2.5 % cream APPLY SMALL AMOUNT TO AFFECTED AREA  AS NEEDED APPLY TO THE AFFECTED AREAS OF THE FACE    ? lisinopril (ZESTRIL) 40 MG tablet Take 1 tablet by mouth daily.    ? loratadine (CLARITIN) 10 MG tablet TAKE ONE TABLET BY MOUTH DAILY AS NEEDED FOR ALLERGY SYMPTOMS    ? predniSONE (DELTASONE) 20 MG tablet TAKE TWO TABLETS BY MOUTH DAILY FOR INFLAMMATION, SCIATICA (TAKE WITH FOOD)    ? promethazine (PHENERGAN) 25 MG tablet TAKE ONE-HALF TABLET BY MOUTH EVERY 8 HOURS AS NEEDED FOR NAUSEA (MAY CAUSE DROWSINESS)    ? acetaminophen (TYLENOL) 500 MG tablet Take by mouth.    ? acyclovir (ZOVIRAX) 800 MG tablet Take by mouth.    ? aspirin 81 MG tablet Take 81 mg by mouth daily.    ? carbidopa-levodopa (SINEMET IR) 25-100 MG tablet Take 1 tablet by mouth at bedtime.    ? carvedilol (COREG) 25 MG tablet Take by mouth.    ? cetirizine (ZYRTEC) 10 MG tablet Take 10 mg by mouth daily.    ? Cholecalciferol 2000 UNITS CAPS Take 1 capsule by mouth daily.    ? ciclopirox (PENLAC) 8 % solution     ? citalopram (CELEXA) 40 MG tablet Take 40 mg by mouth daily as needed.    ? diclofenac sodium (VOLTAREN) 1 %  GEL Apply topically.    ? diphenhydrAMINE (BENADRYL) 50 MG capsule Take by mouth.    ? donepezil (ARICEPT) 10 MG tablet Take 10 mg by mouth at bedtime.    ? doxazosin (CARDURA) 8 MG tablet Take by mouth.    ? doxycycline (PERIOSTAT) 20 MG tablet Take by mouth.    ? famciclovir (FAMVIR) 500 MG tablet Take 500 mg by mouth 3 (three) times daily.    ? finasteride (PROSCAR) 5 MG tablet Take by mouth.    ? fluorouracil (EFUDEX) 5 % cream APPLY  SMALL AMOUNT TO AFFECTED AREA AT BEDTIME START OCT OR JAN. APPLY 4-6  WEEKS AS TOLERATED; WASH, PAT DRY, DO NOT RUB; TREAT:  SCALP/FACE/EARS/NECK/ARMS; DO NOT APPLY NEAR EYES, NOSTRILS, MOUTH; WASH  HANDS  AFTERWARDS; APPLY MOISTURIZER EVERY 2 HOURS WHEN AWAKE; AVOID SUN. SEE  HANDOUT START OCT OR JAN. APPLY 4-6  WEEKS AS TOLERATED; WASH, PAT DRY, DO NOT RUB; TREAT:  SCALP/FACE/EARS/NECK/ARMS; DO NOT APPLY NEAR EYES, NOSTRILS, MOUTH; WASH   HANDS  AFTERWARDS; APPLY MOISTURIZER EVERY 2 HOURS WHEN AWAKE; AVOID SUN. SEE   HANDOUT    ? fluticasone (FLONASE) 50 MCG/ACT nasal spray Place 2 sprays into both nostrils 2 (two) times daily. 16 g 11  ? gabapentin (NEURONTIN) 300 MG capsule     ? ibuprofen (ADVIL) 400 MG tablet Take 1 tablet by mouth daily as needed.    ? isosorbide mononitrate (IMDUR) 30 MG 24 hr tablet Take by mouth.    ? labetalol (NORMODYNE) 100 MG tablet Take 100 mg by mouth 2 (two) times daily.    ? levothyroxine (SYNTHROID) 50 MCG tablet Take by mouth.    ? lidocaine (LIDODERM) 5 % 2 times a day    ? LORazepam (ATIVAN) 0.5 MG tablet Take 1 tablet by mouth 2 (two) times daily as needed.    ? MENTHOL-METHYL SALICYLATE EX Apply topically 4 (four) times daily.    ? methocarbamol (ROBAXIN) 750 MG tablet 4 times a day    ? mirabegron ER (MYRBETRIQ) 25 MG TB24 tablet Take 25 mg by mouth daily.    ? morphine (MSIR) 15 MG tablet 2 times a day as needed    ? mupirocin ointment (BACTROBAN) 2 % APPLY OINTMENT TO AFFECTED AREA(S) THREE TIMES DAILY FOR 7 10 DAYS    ? potassium chloride (MICRO-K) 10 MEQ CR capsule Take 10 mEq by mouth 2 (two) times daily.    ? pravastatin (PRAVACHOL) 40 MG tablet Take 40 mg by mouth daily.    ? prazosin (MINIPRESS) 1 MG capsule TAKE ONE CAPSULE BY MOUTH AT BEDTIME FOR NIGHTMARES    ? pregabalin (LYRICA) 50 MG capsule 3 times a day    ? QUEtiapine (SEROQUEL) 25 MG tablet Take 25 mg by mouth as needed (insomnia).    ? spironolactone (ALDACTONE) 25 MG tablet Take 0.5 tablets by mouth  daily.    ? TraMADol HCl 100 MG TB24 3 times a day    ? triamcinolone cream (KENALOG) 0.1 % Apply topically.    ? venlafaxine (EFFEXOR) 75 MG tablet TAKE TWO TABLETS BY MOUTH DAILY FOR DEPRESSION AND ANXIETY    ? ?No current facility-administered medications on file prior to visit.  ? ? ?Allergies  ?Allergen Reactions  ? Sinemet [Carbidopa-Levodopa] Other (See Comments)  ? Amlodipine Swelling  ? Clonidine Other (See Comments)  ? No Known Allergies Swelling  ?  Other reaction(s): Edema of lower extremity  ? ? ?Objective: ?Physical Exam ? ?General: Well developed, nourished,  no acute distress, awake, alert and oriented x 3 ? ?Vascular: Dorsalis pedis artery 1/4 bilateral, Posterior tibial artery 1/4 bilateral, skin temperature warm to warm proximal to distal bilateral lower extremities, mild varicosities, pedal hair present bilateral. ? ?Neurological: Gross sensation present via light touch bilateral.  ? ?Dermatological: Skin is warm, dry, and supple bilateral, Nails 1-10 are tender, long, thick, and discolored with mild subungal debris, no webspace macerations present bilateral, no open lesions present bilateral, no callus/corns/hyperkeratotic tissue present bilateral.  No signs of infection. ? ?Musculoskeletal: Asymptomatic pes planus boney deformities noted bilateral. Muscular strength within normal limits without painon range of motion. No pain with calf compression bilateral. Tremor secondary to parkinsons.  ? ?Assessment and Plan:  ?Problem List Items Addressed This Visit   ?None ?Visit Diagnoses   ? ? Pain due to onychomycosis of toenail    -  Primary  ? History of Parkinson's disease      ? ?  ? ?-Examined patient.  ?-Re-Discussed treatment options for painful mycotic nails. ?-Mechanically debrided and reduced all painful mycotic nails with sterile nail nipper and dremel nail file. ?-Continue with cane for stability in gait since patient has Parkinson's with tremor ?-Return to office in 2.5-3 months or sooner  if problems or issues arise ? ?Asencion Islam, DPM ? ?

## 2021-09-18 ENCOUNTER — Ambulatory Visit (INDEPENDENT_AMBULATORY_CARE_PROVIDER_SITE_OTHER): Payer: No Typology Code available for payment source | Admitting: Podiatry

## 2021-09-18 ENCOUNTER — Encounter: Payer: Self-pay | Admitting: Podiatry

## 2021-09-18 DIAGNOSIS — B351 Tinea unguium: Secondary | ICD-10-CM

## 2021-09-18 DIAGNOSIS — M79676 Pain in unspecified toe(s): Secondary | ICD-10-CM | POA: Diagnosis not present

## 2021-09-18 DIAGNOSIS — Z8669 Personal history of other diseases of the nervous system and sense organs: Secondary | ICD-10-CM

## 2021-09-18 NOTE — Progress Notes (Signed)
Subjective: Brian Jennings is a 87 y.o. male patient seen today in office with complaint of painful thickened and elongated toenails; unable to trim. Reports that he is doing ok still struggling with Parkinsons No other concerns noted at this visit.  Patient Active Problem List   Diagnosis Date Noted   Actinic keratosis 06/16/2021   Anemia 06/16/2021   Arthritis 06/16/2021   Atopic dermatitis, unspecified 06/16/2021   Benign essential hypertension 06/16/2021   Hypertrophy of prostate without urinary obstruction and other lower urinary tract symptoms (LUTS) 06/16/2021   Calculus of kidney 06/16/2021   Encounter for fitting and adjustment of hearing aid 06/16/2021   Encounter for other administrative examinations 06/16/2021   Essential tremor 06/16/2021   Frequency of micturition 06/16/2021   Generalized anxiety disorder 06/16/2021   Genital herpes 06/16/2021   Hyperlipidemia, unspecified 06/16/2021   Hypothyroidism 06/16/2021   Isolated proteinuria 06/16/2021   Low back pain, unspecified 06/16/2021   Major depressive disorder, recurrent, mild ([place]) 06/16/2021   Mild cognitive disorder 06/16/2021   Morbid obesity ([place]) 06/16/2021   Other specified disease of sebaceous glands 06/16/2021   Sensorineural hearing loss, bilateral 06/16/2021   Personal history of other malignant neoplasm of skin 06/16/2021   Persons encountering health services in other specified circumstances 06/16/2021   Presence of intraocular lens 06/16/2021   Prurigo nodularis 06/16/2021   Pure hypercholesterolemia 06/16/2021   Recurrent major depression ([place]) 06/16/2021   Restrictive lung disease 06/16/2021   Schwannomatosis ([place]) 06/16/2021   Sleep apnea, unspecified 06/16/2021   Thoracic aortic aneurysm without rupture ([place]) 06/16/2021   Tremor, unspecified 06/16/2021   DDD (degenerative disc disease), lumbar 06/05/2018   Sacroiliac inflammation ([place]) 03/11/2018   Cervical radiculopathy 12/09/2017    Allergic rhinitis 10/30/2013   Dyspnea 10/20/2013    Current Outpatient Medications on File Prior to Visit  Medication Sig Dispense Refill   acetaminophen (TYLENOL) 500 MG tablet Take by mouth.     acyclovir (ZOVIRAX) 800 MG tablet Take by mouth.     aspirin 81 MG tablet Take 81 mg by mouth daily.     carbidopa-levodopa (SINEMET IR) 25-100 MG tablet Take 1 tablet by mouth at bedtime.     carvedilol (COREG) 25 MG tablet Take by mouth.     cetirizine (ZYRTEC) 10 MG tablet Take 10 mg by mouth daily.     Cholecalciferol 2000 UNITS CAPS Take 1 capsule by mouth daily.     ciclopirox (PENLAC) 8 % solution      citalopram (CELEXA) 40 MG tablet Take 40 mg by mouth daily as needed.     diclofenac sodium (VOLTAREN) 1 % GEL Apply topically.     diphenhydrAMINE (BENADRYL) 50 MG capsule Take by mouth.     donepezil (ARICEPT) 10 MG tablet Take 10 mg by mouth at bedtime.     doxazosin (CARDURA) 8 MG tablet Take by mouth.     doxycycline (PERIOSTAT) 20 MG tablet Take by mouth.     famciclovir (FAMVIR) 500 MG tablet Take 500 mg by mouth 3 (three) times daily.     famotidine (PEPCID) 40 MG tablet Take 1 tablet by mouth at bedtime.     finasteride (PROSCAR) 5 MG tablet Take by mouth.     fluorouracil (EFUDEX) 5 % cream APPLY SMALL AMOUNT TO AFFECTED AREA AT BEDTIME START OCT OR JAN. APPLY 4-6  WEEKS AS TOLERATED; [place], PAT DRY, DO NOT RUB; TREAT:  SCALP/FACE/EARS/NECK/ARMS; DO NOT APPLY NEAR EYES, NOSTRILS, MOUTH; [place]  HANDS  AFTERWARDS; APPLY MOISTURIZER  EVERY 2 HOURS WHEN AWAKE; AVOID SUN. SEE  HANDOUT START OCT OR JAN. APPLY 4-6  WEEKS AS TOLERATED; WASH, PAT DRY, DO NOT RUB; TREAT:  SCALP/FACE/EARS/NECK/ARMS; DO NOT APPLY NEAR EYES, NOSTRILS, MOUTH; WASH   HANDS  AFTERWARDS; APPLY MOISTURIZER EVERY 2 HOURS WHEN AWAKE; AVOID SUN. SEE   HANDOUT     fluticasone (FLONASE) 50 MCG/ACT nasal spray Place 2 sprays into both nostrils 2 (two) times daily. 16 g 11   gabapentin (NEURONTIN) 300 MG capsule       hydrALAZINE (APRESOLINE) 100 MG tablet Take 1 tablet by mouth 3 (three) times daily.     hydrocortisone 2.5 % cream APPLY SMALL AMOUNT TO AFFECTED AREA  AS NEEDED APPLY TO THE AFFECTED AREAS OF THE FACE     ibuprofen (ADVIL) 400 MG tablet Take 1 tablet by mouth daily as needed.     isosorbide mononitrate (IMDUR) 30 MG 24 hr tablet Take by mouth.     labetalol (NORMODYNE) 100 MG tablet Take 100 mg by mouth 2 (two) times daily.     levothyroxine (SYNTHROID) 50 MCG tablet Take by mouth.     lidocaine (LIDODERM) 5 % 2 times a day     lisinopril (ZESTRIL) 40 MG tablet Take 1 tablet by mouth daily.     loratadine (CLARITIN) 10 MG tablet TAKE ONE TABLET BY MOUTH DAILY AS NEEDED FOR ALLERGY SYMPTOMS     LORazepam (ATIVAN) 0.5 MG tablet Take 1 tablet by mouth 2 (two) times daily as needed.     MENTHOL-METHYL SALICYLATE EX Apply topically 4 (four) times daily.     methocarbamol (ROBAXIN) 750 MG tablet 4 times a day     mirabegron ER (MYRBETRIQ) 25 MG TB24 tablet Take 25 mg by mouth daily.     morphine (MSIR) 15 MG tablet 2 times a day as needed     mupirocin ointment (BACTROBAN) 2 % APPLY OINTMENT TO AFFECTED AREA(S) THREE TIMES DAILY FOR 7 10 DAYS     potassium chloride (MICRO-K) 10 MEQ CR capsule Take 10 mEq by mouth 2 (two) times daily.     pravastatin (PRAVACHOL) 40 MG tablet Take 40 mg by mouth daily.     prazosin (MINIPRESS) 1 MG capsule TAKE ONE CAPSULE BY MOUTH AT BEDTIME FOR NIGHTMARES     predniSONE (DELTASONE) 20 MG tablet TAKE TWO TABLETS BY MOUTH DAILY FOR INFLAMMATION, SCIATICA (TAKE WITH FOOD)     pregabalin (LYRICA) 50 MG capsule 3 times a day     promethazine (PHENERGAN) 25 MG tablet TAKE ONE-HALF TABLET BY MOUTH EVERY 8 HOURS AS NEEDED FOR NAUSEA (MAY CAUSE DROWSINESS)     QUEtiapine (SEROQUEL) 25 MG tablet Take 25 mg by mouth as needed (insomnia).     spironolactone (ALDACTONE) 25 MG tablet Take 0.5 tablets by mouth daily.     TraMADol HCl 100 MG TB24 3 times a day     triamcinolone  cream (KENALOG) 0.1 % Apply topically.     venlafaxine (EFFEXOR) 75 MG tablet TAKE TWO TABLETS BY MOUTH DAILY FOR DEPRESSION AND ANXIETY     No current facility-administered medications on file prior to visit.    Allergies  Allergen Reactions   Sinemet [Carbidopa-Levodopa] Other (See Comments)   Amlodipine Swelling   Clonidine Other (See Comments)   No Known Allergies Swelling    Other reaction(s): Edema of lower extremity    Objective: Physical Exam  General: Well developed, nourished, no acute distress, awake, alert and oriented x 3  Vascular: Dorsalis  pedis artery 1/4 bilateral, Posterior tibial artery 1/4 bilateral, skin temperature warm to warm proximal to distal bilateral lower extremities, mild varicosities, pedal hair present bilateral.  Neurological: Gross sensation present via light touch bilateral.   Dermatological: Skin is warm, dry, and supple bilateral, Nails 1-10 are tender, long, thick, and discolored with mild subungal debris, no webspace macerations present bilateral, no open lesions present bilateral, no callus/corns/hyperkeratotic tissue present bilateral.  No signs of infection.  Musculoskeletal: Asymptomatic pes planus boney deformities noted bilateral. Muscular strength within normal limits without painon range of motion. No pain with calf compression bilateral. Tremor secondary to parkinsons.   Assessment and Plan:  Problem List Items Addressed This Visit   None Visit Diagnoses     Pain due to onychomycosis of toenail    -  Primary   History of Parkinson's disease           -Examined patient.  -Re-Discussed treatment options for painful mycotic nails. -Mechanically debrided and reduced all painful mycotic nails with sterile nail nipper and dremel nail file. -Continue with cane for stability in gait since patient has Parkinson's with tremor -Return to office in 2.5-3 months or sooner if problems or issues arise  Louann Sjogren, DPM

## 2021-10-11 DIAGNOSIS — L300 Nummular dermatitis: Secondary | ICD-10-CM | POA: Diagnosis not present

## 2021-10-11 DIAGNOSIS — L299 Pruritus, unspecified: Secondary | ICD-10-CM | POA: Diagnosis not present

## 2021-12-14 ENCOUNTER — Ambulatory Visit (INDEPENDENT_AMBULATORY_CARE_PROVIDER_SITE_OTHER): Payer: Medicare HMO | Admitting: Podiatry

## 2021-12-14 DIAGNOSIS — M79676 Pain in unspecified toe(s): Secondary | ICD-10-CM

## 2021-12-14 DIAGNOSIS — B351 Tinea unguium: Secondary | ICD-10-CM

## 2021-12-18 ENCOUNTER — Encounter: Payer: Self-pay | Admitting: Podiatry

## 2021-12-18 NOTE — Progress Notes (Signed)
  Subjective:  Patient ID: Brian Jennings, male    DOB: 15-Sep-1934,  MRN: [id number]  Brian Jennings presents to clinic today for  Chief Complaint  Patient presents with   Nail Problem    Routine foot care PCP-[facility] [place]  PCP V[facility]- Do not know  . New problem(s): None.   PCP is Marrian Salvage, FNP.  Allergies  Allergen Reactions   Benztropine     Other reaction(s): Malaise   Sinemet [Carbidopa-Levodopa] Other (See Comments)   Amlodipine Swelling   Clonidine Other (See Comments)   No Known Allergies Swelling    Other reaction(s): Edema of lower extremity   Review of Systems: Negative except as noted in the HPI.  Objective: No changes noted in today's physical examination.  Brian Jennings is a pleasant 87 y.o. male morbidly obese in NAD. AAO x 3.  Vascular Examination: CFT <3 seconds b/l. DP/PT pulses faintly palpable b/l. Skin temperature gradient warm to warm b/l. No pain with calf compression. No ischemia or gangrene. No cyanosis or clubbing noted b/l.    Neurological Examination: Sensation grossly intact b/l with 10 gram monofilament. Vibratory sensation intact b/l. Protective sensation intact 5/5 intact bilaterally with 10g monofilament b/l. Vibratory sensation intact b/l. Proprioception intact bilaterally. Involuntary tremors noted b/l feet.  Dermatological Examination: Pedal skin warm and supple b/l. Toenails 1-5 b/l thick, discolored, elongated with subungual debris and pain on dorsal palpation.  No open wounds b/l LE. No interdigital macerations noted b/l LE. No hyperkeratotic nor porokeratotic lesions present on today's visit.  Musculoskeletal Examination: Muscle strength 5/5 to b/l LE. Pes planus deformity noted bilateral LE.  Radiographs: None  Assessment/Plan: 1. Pain due to onychomycosis of toenail     No orders of the defined types were placed in this encounter.   -Patient was evaluated and treated. All patient's and/or POA's  questions/concerns answered on today's visit. -No new findings. No new orders. -Patient to continue soft, supportive shoe gear daily. -Mycotic toenails 1-5 bilaterally were debrided in length and girth with sterile nail nippers and dremel without incident. -Patient/POA to call should there be question/concern in the interim.   Return in about 3 months (around 03/16/2022).  Marzetta Board, DPM

## 2022-01-08 ENCOUNTER — Ambulatory Visit: Payer: No Typology Code available for payment source | Admitting: Neurology

## 2022-03-21 ENCOUNTER — Ambulatory Visit (INDEPENDENT_AMBULATORY_CARE_PROVIDER_SITE_OTHER): Payer: Medicare HMO | Admitting: Podiatry

## 2022-03-21 ENCOUNTER — Encounter: Payer: Self-pay | Admitting: Podiatry

## 2022-03-21 DIAGNOSIS — Z8669 Personal history of other diseases of the nervous system and sense organs: Secondary | ICD-10-CM | POA: Diagnosis not present

## 2022-03-21 DIAGNOSIS — B351 Tinea unguium: Secondary | ICD-10-CM

## 2022-03-21 DIAGNOSIS — M79676 Pain in unspecified toe(s): Secondary | ICD-10-CM

## 2022-03-21 NOTE — Progress Notes (Signed)
Subjective: Brian Jennings is a 88 y.o. male patient seen today in office with complaint of painful thickened and elongated toenails; unable to trim. Reports that he is doing ok still struggling with Parkinsons No other concerns noted at this visit.  Patient Active Problem List   Diagnosis Date Noted   Actinic keratosis 06/16/2021   Anemia 06/16/2021   Arthritis 06/16/2021   Atopic dermatitis, unspecified 06/16/2021   Benign essential hypertension 06/16/2021   Hypertrophy of prostate without urinary obstruction and other lower urinary tract symptoms (LUTS) 06/16/2021   Calculus of kidney 06/16/2021   Encounter for fitting and adjustment of hearing aid 06/16/2021   Encounter for other administrative examinations 06/16/2021   Essential tremor 06/16/2021   Frequency of micturition 06/16/2021   Generalized anxiety disorder 06/16/2021   Genital herpes 06/16/2021   Hyperlipidemia, unspecified 06/16/2021   Hypothyroidism 06/16/2021   Isolated proteinuria 06/16/2021   Low back pain, unspecified 06/16/2021   Major depressive disorder, recurrent, mild ([place]) 06/16/2021   Mild cognitive disorder 06/16/2021   Morbid obesity ([place]) 06/16/2021   Other specified disease of sebaceous glands 06/16/2021   Sensorineural hearing loss, bilateral 06/16/2021   Personal history of other malignant neoplasm of skin 06/16/2021   Persons encountering health services in other specified circumstances 06/16/2021   Presence of intraocular lens 06/16/2021   Prurigo nodularis 06/16/2021   Pure hypercholesterolemia 06/16/2021   Recurrent major depression ([place]) 06/16/2021   Restrictive lung disease 06/16/2021   Schwannomatosis ([place]) 06/16/2021   Sleep apnea, unspecified 06/16/2021   Thoracic aortic aneurysm without rupture ([place]) 06/16/2021   Tremor, unspecified 06/16/2021   DDD (degenerative disc disease), lumbar 06/05/2018   Sacroiliac inflammation ([place]) 03/11/2018   Cervical radiculopathy 12/09/2017    Allergic rhinitis 10/30/2013   Dyspnea 10/20/2013    Current Outpatient Medications on File Prior to Visit  Medication Sig Dispense Refill   acetaminophen (TYLENOL) 500 MG tablet Take by mouth.     acyclovir (ZOVIRAX) 800 MG tablet Take by mouth.     aspirin 81 MG tablet Take 81 mg by mouth daily.     carbidopa-levodopa (SINEMET IR) 25-100 MG tablet Take 1 tablet by mouth at bedtime.     carvedilol (COREG) 25 MG tablet Take by mouth.     cetirizine (ZYRTEC) 10 MG tablet Take 10 mg by mouth daily.     Cholecalciferol 2000 UNITS CAPS Take 1 capsule by mouth daily.     ciclopirox (PENLAC) 8 % solution      citalopram (CELEXA) 40 MG tablet Take 40 mg by mouth daily as needed.     diclofenac sodium (VOLTAREN) 1 % GEL Apply topically.     diphenhydrAMINE (BENADRYL) 50 MG capsule Take by mouth.     donepezil (ARICEPT) 10 MG tablet Take 10 mg by mouth at bedtime.     doxazosin (CARDURA) 8 MG tablet Take by mouth.     doxycycline (PERIOSTAT) 20 MG tablet Take by mouth.     famciclovir (FAMVIR) 500 MG tablet Take 500 mg by mouth 3 (three) times daily.     famotidine (PEPCID) 40 MG tablet Take 1 tablet by mouth at bedtime.     finasteride (PROSCAR) 5 MG tablet Take by mouth.     fluorouracil (EFUDEX) 5 % cream APPLY SMALL AMOUNT TO AFFECTED AREA AT BEDTIME START OCT OR JAN. APPLY 4-6  WEEKS AS TOLERATED; [place], PAT DRY, DO NOT RUB; TREAT:  SCALP/FACE/EARS/NECK/ARMS; DO NOT APPLY NEAR EYES, NOSTRILS, MOUTH; [place]  HANDS  AFTERWARDS; APPLY MOISTURIZER  EVERY 2 HOURS WHEN AWAKE; AVOID SUN. SEE  HANDOUT START OCT OR JAN. APPLY 4-6  WEEKS AS TOLERATED; [place], PAT DRY, DO NOT RUB; TREAT:  SCALP/FACE/EARS/NECK/ARMS; DO NOT APPLY NEAR EYES, NOSTRILS, MOUTH; [place]   HANDS  AFTERWARDS; APPLY MOISTURIZER EVERY 2 HOURS WHEN AWAKE; AVOID SUN. SEE   HANDOUT     fluticasone (FLONASE) 50 MCG/ACT nasal spray Place 2 sprays into both nostrils 2 (two) times daily. 16 g 11   gabapentin (NEURONTIN) 300 MG capsule       hydrALAZINE (APRESOLINE) 100 MG tablet Take 1 tablet by mouth 3 (three) times daily.     hydrocortisone 2.5 % cream APPLY SMALL AMOUNT TO AFFECTED AREA  AS NEEDED APPLY TO THE AFFECTED AREAS OF THE FACE     ibuprofen (ADVIL) 400 MG tablet Take 1 tablet by mouth daily as needed.     isosorbide mononitrate (IMDUR) 30 MG 24 hr tablet Take by mouth.     labetalol (NORMODYNE) 100 MG tablet Take 100 mg by mouth 2 (two) times daily.     levothyroxine (SYNTHROID) 50 MCG tablet Take by mouth.     lidocaine (LIDODERM) 5 % 2 times a day     lisinopril (ZESTRIL) 40 MG tablet Take 1 tablet by mouth daily.     loratadine (CLARITIN) 10 MG tablet TAKE ONE TABLET BY MOUTH DAILY AS NEEDED FOR ALLERGY SYMPTOMS     LORazepam (ATIVAN) 0.5 MG tablet Take 1 tablet by mouth 2 (two) times daily as needed.     MENTHOL-METHYL SALICYLATE EX Apply topically 4 (four) times daily.     methocarbamol (ROBAXIN) 750 MG tablet 4 times a day     mirabegron ER (MYRBETRIQ) 25 MG TB24 tablet Take 25 mg by mouth daily.     morphine (MSIR) 15 MG tablet 2 times a day as needed     mupirocin ointment (BACTROBAN) 2 % APPLY OINTMENT TO AFFECTED AREA(S) THREE TIMES DAILY FOR 7 10 DAYS     potassium chloride (MICRO-K) 10 MEQ CR capsule Take 10 mEq by mouth 2 (two) times daily.     pravastatin (PRAVACHOL) 40 MG tablet Take 40 mg by mouth daily.     prazosin (MINIPRESS) 1 MG capsule TAKE ONE CAPSULE BY MOUTH AT BEDTIME FOR NIGHTMARES     predniSONE (DELTASONE) 20 MG tablet TAKE TWO TABLETS BY MOUTH DAILY FOR INFLAMMATION, SCIATICA (TAKE WITH FOOD)     pregabalin (LYRICA) 50 MG capsule 3 times a day     promethazine (PHENERGAN) 25 MG tablet TAKE ONE-HALF TABLET BY MOUTH EVERY 8 HOURS AS NEEDED FOR NAUSEA (MAY CAUSE DROWSINESS)     QUEtiapine (SEROQUEL) 25 MG tablet Take 25 mg by mouth as needed (insomnia).     spironolactone (ALDACTONE) 25 MG tablet Take 0.5 tablets by mouth daily.     TraMADol HCl 100 MG TB24 3 times a day     triamcinolone  cream (KENALOG) 0.1 % Apply topically.     venlafaxine (EFFEXOR) 75 MG tablet TAKE TWO TABLETS BY MOUTH DAILY FOR DEPRESSION AND ANXIETY     No current facility-administered medications on file prior to visit.    Allergies  Allergen Reactions   Benztropine     Other reaction(s): Malaise   Sinemet [Carbidopa-Levodopa] Other (See Comments)   Amlodipine Swelling   Clonidine Other (See Comments)   No Known Allergies Swelling    Other reaction(s): Edema of lower extremity    Objective: Physical Exam  General: Well developed, nourished, no acute  distress, awake, alert and oriented x 3  Vascular: Dorsalis pedis artery 1/4 bilateral, Posterior tibial artery 1/4 bilateral, skin temperature warm to warm proximal to distal bilateral lower extremities, mild varicosities, pedal hair present bilateral.  Neurological: Gross sensation present via light touch bilateral.   Dermatological: Skin is warm, dry, and supple bilateral, Nails 1-10 are tender, long, thick, and discolored with mild subungal debris, no webspace macerations present bilateral, no open lesions present bilateral, no callus/corns/hyperkeratotic tissue present bilateral.  No signs of infection.  Musculoskeletal: Asymptomatic pes planus boney deformities noted bilateral. Muscular strength within normal limits without painon range of motion. No pain with calf compression bilateral. Tremor secondary to parkinsons.   Assessment and Plan:  Problem List Items Addressed This Visit   None Visit Diagnoses     Pain due to onychomycosis of toenail    -  Primary   History of Parkinson's disease            -Examined patient.  -Re-Discussed treatment options for painful mycotic nails. -Mechanically debrided and reduced all painful mycotic nails with sterile nail nipper and dremel nail file. -Continue with cane for stability in gait since patient has Parkinson's with tremor -Return to office in 2.5-3 months or sooner if problems or  issues arise  Louann Sjogren, DPM

## 2022-06-20 ENCOUNTER — Encounter: Payer: Self-pay | Admitting: Podiatry

## 2022-06-20 ENCOUNTER — Ambulatory Visit (INDEPENDENT_AMBULATORY_CARE_PROVIDER_SITE_OTHER): Payer: No Typology Code available for payment source | Admitting: Podiatry

## 2022-06-20 DIAGNOSIS — M79676 Pain in unspecified toe(s): Secondary | ICD-10-CM

## 2022-06-20 DIAGNOSIS — B351 Tinea unguium: Secondary | ICD-10-CM

## 2022-06-20 DIAGNOSIS — Z8669 Personal history of other diseases of the nervous system and sense organs: Secondary | ICD-10-CM

## 2022-06-20 NOTE — Progress Notes (Signed)
Subjective: Brian Jennings is a 88 y.o. male patient seen today in office with complaint of painful thickened and elongated toenails; unable to trim. Reports that he is doing ok still struggling with Parkinsons No other concerns noted at this visit.  Patient Active Problem List   Diagnosis Date Noted   Actinic keratosis 06/16/2021   Anemia 06/16/2021   Arthritis 06/16/2021   Atopic dermatitis, unspecified 06/16/2021   Benign essential hypertension 06/16/2021   Hypertrophy of prostate without urinary obstruction and other lower urinary tract symptoms (LUTS) 06/16/2021   Calculus of kidney 06/16/2021   Encounter for fitting and adjustment of hearing aid 06/16/2021   Encounter for other administrative examinations 06/16/2021   Essential tremor 06/16/2021   Frequency of micturition 06/16/2021   Generalized anxiety disorder 06/16/2021   Genital herpes 06/16/2021   Hyperlipidemia, unspecified 06/16/2021   Hypothyroidism 06/16/2021   Isolated proteinuria 06/16/2021   Low back pain, unspecified 06/16/2021   Major depressive disorder, recurrent, mild 06/16/2021   Mild cognitive disorder 06/16/2021   Morbid obesity 06/16/2021   Other specified disease of sebaceous glands 06/16/2021   Sensorineural hearing loss, bilateral 06/16/2021   Personal history of other malignant neoplasm of skin 06/16/2021   Persons encountering health services in other specified circumstances 06/16/2021   Presence of intraocular lens 06/16/2021   Prurigo nodularis 06/16/2021   Pure hypercholesterolemia 06/16/2021   Recurrent major depression 06/16/2021   Restrictive lung disease 06/16/2021   Schwannomatosis 06/16/2021   Sleep apnea, unspecified 06/16/2021   Thoracic aortic aneurysm without rupture 06/16/2021   Tremor, unspecified 06/16/2021   DDD (degenerative disc disease), lumbar 06/05/2018   Sacroiliac inflammation 03/11/2018   Cervical radiculopathy 12/09/2017   Allergic rhinitis 10/30/2013   Dyspnea  10/20/2013    Current Outpatient Medications on File Prior to Visit  Medication Sig Dispense Refill   acetaminophen (TYLENOL) 500 MG tablet Take by mouth.     acyclovir (ZOVIRAX) 800 MG tablet Take by mouth.     aspirin 81 MG tablet Take 81 mg by mouth daily.     carbidopa-levodopa (SINEMET IR) 25-100 MG tablet Take 1 tablet by mouth at bedtime.     carvedilol (COREG) 25 MG tablet Take by mouth.     cetirizine (ZYRTEC) 10 MG tablet Take 10 mg by mouth daily.     Cholecalciferol 2000 UNITS CAPS Take 1 capsule by mouth daily.     ciclopirox (PENLAC) 8 % solution      citalopram (CELEXA) 40 MG tablet Take 40 mg by mouth daily as needed.     diclofenac sodium (VOLTAREN) 1 % GEL Apply topically.     diphenhydrAMINE (BENADRYL) 50 MG capsule Take by mouth.     donepezil (ARICEPT) 10 MG tablet Take 10 mg by mouth at bedtime.     doxazosin (CARDURA) 8 MG tablet Take by mouth.     doxycycline (PERIOSTAT) 20 MG tablet Take by mouth.     famciclovir (FAMVIR) 500 MG tablet Take 500 mg by mouth 3 (three) times daily.     famotidine (PEPCID) 40 MG tablet Take 1 tablet by mouth at bedtime.     finasteride (PROSCAR) 5 MG tablet Take by mouth.     fluorouracil (EFUDEX) 5 % cream APPLY SMALL AMOUNT TO AFFECTED AREA AT BEDTIME START OCT OR JAN. APPLY 4-6  WEEKS AS TOLERATED; [place], PAT DRY, DO NOT RUB; TREAT:  SCALP/FACE/EARS/NECK/ARMS; DO NOT APPLY NEAR EYES, NOSTRILS, MOUTH; [place]  HANDS  AFTERWARDS; APPLY MOISTURIZER EVERY 2 HOURS WHEN AWAKE; AVOID  SUN. SEE  HANDOUT START OCT OR JAN. APPLY 4-6  WEEKS AS TOLERATED; [place], PAT DRY, DO NOT RUB; TREAT:  SCALP/FACE/EARS/NECK/ARMS; DO NOT APPLY NEAR EYES, NOSTRILS, MOUTH; [place]   HANDS  AFTERWARDS; APPLY MOISTURIZER EVERY 2 HOURS WHEN AWAKE; AVOID SUN. SEE   HANDOUT     fluticasone (FLONASE) 50 MCG/ACT nasal spray Place 2 sprays into both nostrils 2 (two) times daily. 16 g 11   gabapentin (NEURONTIN) 300 MG capsule      hydrALAZINE (APRESOLINE) 100 MG tablet Take 1  tablet by mouth 3 (three) times daily.     hydrocortisone 2.5 % cream APPLY SMALL AMOUNT TO AFFECTED AREA  AS NEEDED APPLY TO THE AFFECTED AREAS OF THE FACE     ibuprofen (ADVIL) 400 MG tablet Take 1 tablet by mouth daily as needed.     isosorbide mononitrate (IMDUR) 30 MG 24 hr tablet Take by mouth.     labetalol (NORMODYNE) 100 MG tablet Take 100 mg by mouth 2 (two) times daily.     levothyroxine (SYNTHROID) 50 MCG tablet Take by mouth.     lidocaine (LIDODERM) 5 % 2 times a day     lisinopril (ZESTRIL) 40 MG tablet Take 1 tablet by mouth daily.     loratadine (CLARITIN) 10 MG tablet TAKE ONE TABLET BY MOUTH DAILY AS NEEDED FOR ALLERGY SYMPTOMS     LORazepam (ATIVAN) 0.5 MG tablet Take 1 tablet by mouth 2 (two) times daily as needed.     MENTHOL-METHYL SALICYLATE EX Apply topically 4 (four) times daily.     methocarbamol (ROBAXIN) 750 MG tablet 4 times a day     mirabegron ER (MYRBETRIQ) 25 MG TB24 tablet Take 25 mg by mouth daily.     morphine (MSIR) 15 MG tablet 2 times a day as needed     mupirocin ointment (BACTROBAN) 2 % APPLY OINTMENT TO AFFECTED AREA(S) THREE TIMES DAILY FOR 7 10 DAYS     potassium chloride (MICRO-K) 10 MEQ CR capsule Take 10 mEq by mouth 2 (two) times daily.     pravastatin (PRAVACHOL) 40 MG tablet Take 40 mg by mouth daily.     prazosin (MINIPRESS) 1 MG capsule TAKE ONE CAPSULE BY MOUTH AT BEDTIME FOR NIGHTMARES     predniSONE (DELTASONE) 20 MG tablet TAKE TWO TABLETS BY MOUTH DAILY FOR INFLAMMATION, SCIATICA (TAKE WITH FOOD)     pregabalin (LYRICA) 50 MG capsule 3 times a day     promethazine (PHENERGAN) 25 MG tablet TAKE ONE-HALF TABLET BY MOUTH EVERY 8 HOURS AS NEEDED FOR NAUSEA (MAY CAUSE DROWSINESS)     QUEtiapine (SEROQUEL) 25 MG tablet Take 25 mg by mouth as needed (insomnia).     spironolactone (ALDACTONE) 25 MG tablet Take 0.5 tablets by mouth daily.     TraMADol HCl 100 MG TB24 3 times a day     triamcinolone cream (KENALOG) 0.1 % Apply topically.      venlafaxine (EFFEXOR) 75 MG tablet TAKE TWO TABLETS BY MOUTH DAILY FOR DEPRESSION AND ANXIETY     No current facility-administered medications on file prior to visit.    Allergies  Allergen Reactions   Benztropine     Other reaction(s): Malaise   Sinemet [Carbidopa-Levodopa] Other (See Comments)   Amlodipine Swelling   Clonidine Other (See Comments)   No Known Allergies Swelling    Other reaction(s): Edema of lower extremity    Objective: Physical Exam  General: Well developed, nourished, no acute distress, awake, alert and oriented x  3  Vascular: Dorsalis pedis artery 1/4 bilateral, Posterior tibial artery 1/4 bilateral, skin temperature warm to warm proximal to distal bilateral lower extremities, mild varicosities, pedal hair present bilateral.  Neurological: Gross sensation present via light touch bilateral.   Dermatological: Skin is warm, dry, and supple bilateral, Nails 1-10 are tender, long, thick, and discolored with mild subungal debris, no webspace macerations present bilateral, no open lesions present bilateral, no callus/corns/hyperkeratotic tissue present bilateral.  No signs of infection.  Musculoskeletal: Asymptomatic pes planus boney deformities noted bilateral. Muscular strength within normal limits without painon range of motion. No pain with calf compression bilateral. Tremor secondary to parkinsons.   Assessment and Plan:  Problem List Items Addressed This Visit   None Visit Diagnoses     Pain due to onychomycosis of toenail    -  Primary   History of Parkinson's disease             -Examined patient.  -Re-Discussed treatment options for painful mycotic nails. -Mechanically debrided and reduced all painful mycotic nails with sterile nail nipper and dremel nail file. -Continue with cane for stability in gait since patient has Parkinson's with tremor -Return to office in 9 weeks or sooner if problems or issues arise  Louann Sjogren, DPM

## 2022-08-21 ENCOUNTER — Ambulatory Visit (INDEPENDENT_AMBULATORY_CARE_PROVIDER_SITE_OTHER): Payer: No Typology Code available for payment source | Admitting: Podiatry

## 2022-08-21 DIAGNOSIS — Z91199 Patient's noncompliance with other medical treatment and regimen due to unspecified reason: Secondary | ICD-10-CM

## 2022-08-21 NOTE — Progress Notes (Signed)
Pt was a no show for apt 

## 2022-09-05 ENCOUNTER — Ambulatory Visit: Payer: No Typology Code available for payment source | Admitting: Podiatry

## 2022-09-26 ENCOUNTER — Ambulatory Visit (INDEPENDENT_AMBULATORY_CARE_PROVIDER_SITE_OTHER): Payer: No Typology Code available for payment source | Admitting: Podiatry

## 2022-09-26 DIAGNOSIS — M79676 Pain in unspecified toe(s): Secondary | ICD-10-CM

## 2022-09-26 DIAGNOSIS — B351 Tinea unguium: Secondary | ICD-10-CM | POA: Diagnosis not present

## 2022-09-26 NOTE — Progress Notes (Signed)
       Subjective:  Patient ID: Brian Jennings, male    DOB: 1934-05-21,  MRN: [id number]   Brian Jennings presents to clinic today for:  Chief Complaint  Patient presents with   Nail Problem    Routine Foot Care-nail trim   . Patient notes nails are thick, discolored, elongated and painful in shoegear when trying to ambulate.  Patient has Parkinson's disease which affects his right upper extremity.  He is not able to trim his nails himself due to unstable hand.  He notes his nails grow quick and would like to stay on a 9-week schedule rather than a 12-week schedule.  He seems to be under the impression that the veterans administration plan would allow him to come once monthly.  PCP is Wilhemina Bonito., MD.  Allergies  Allergen Reactions   Benztropine     Other reaction(s): Malaise   Sinemet [Carbidopa-Levodopa] Other (See Comments)   Amlodipine Swelling   Clonidine Other (See Comments)   No Known Allergies Swelling    Other reaction(s): Edema of lower extremity    Review of Systems: Negative except as noted in the HPI.  Objective:  There were no vitals filed for this visit.  Brian Jennings is a pleasant 88 y.o. male in NAD. AAO x 3.  Vascular Examination: Capillary refill time is 3-5 seconds to toes bilateral. Palpable pedal pulses b/l LE. Digital hair present b/l. No pedal edema b/l. Skin temperature gradient WNL b/l. No varicosities b/l. No cyanosis or clubbing noted b/l.   Dermatological Examination: Pedal skin with normal turgor, texture and tone b/l. No open wounds. No interdigital macerations b/l. Toenails x10 are 3mm thick, discolored, dystrophic with subungual debris. There is pain with compression of the nail plates.  They are elongated x10  Assessment/Plan: 1. Pain due to onychomycosis of toenail    The mycotic toenails were sharply debrided x10 with sterile nail nippers and a power debriding burr to decrease bulk/thickness and length.    Return in about 9  weeks (around 11/28/2022) for RFC.  Informed the patient that most insurances follow Medicare guidelines which do not allow routine footcare type services to be performed any more frequent than every 9 weeks.  Will keep him on this schedule   Jerlene Rockers DBurna Mortimer, DPM, FACFAS [facility]     [address] [place], [place] [address]                Office [phone]  Fax [phone]

## 2022-12-05 ENCOUNTER — Ambulatory Visit: Payer: No Typology Code available for payment source | Admitting: Podiatry

## 2022-12-05 ENCOUNTER — Encounter: Payer: Self-pay | Admitting: Podiatry

## 2022-12-05 DIAGNOSIS — B351 Tinea unguium: Secondary | ICD-10-CM | POA: Diagnosis not present

## 2022-12-05 DIAGNOSIS — M79676 Pain in unspecified toe(s): Secondary | ICD-10-CM

## 2022-12-05 NOTE — Progress Notes (Signed)
       Subjective:  Patient ID: Brian Jennings, male    DOB: [birth year],  MRN: [id number]   Farrell Pantaleo presents to clinic today for:  Chief Complaint  Patient presents with   Routine Foot Care    Nails b/l. Patient unable to maintain thickened, elongated toenails due to parkinsons   Patient notes nails are thick, discolored, elongated and painful in shoegear when trying to ambulate.    PCP is Wilhemina Bonito., MD.  Past Medical History:  Diagnosis Date   Anxiety    Depression    GERD (gastroesophageal reflux disease)    HTN (hypertension)    Hyperlipidemia    Sleep apnea    treatment with CPAP    Past Surgical History:  Procedure Laterality Date   BACK SURGERY  2012   CYSTOSCOPY PROSTATE W/ LASER  1997   WISDOM TOOTH EXTRACTION  age 24    Allergies  Allergen Reactions   Benztropine     Other reaction(s): Malaise   Sinemet [Carbidopa-Levodopa] Other (See Comments)   Amlodipine Swelling   Clonidine Other (See Comments)   No Known Allergies Swelling    Other reaction(s): Edema of lower extremity   Review of Systems: Negative except as noted in the HPI.  Objective:  Giuliano Preece is a pleasant 88 y.o. male in NAD. AAO x 3.  Vascular Examination: Capillary refill time is 3-5 seconds to toes bilateral. Palpable pedal pulses b/l LE. Digital hair present b/l.  Skin temperature gradient WNL b/l. No varicosities b/l. No cyanosis noted b/l.   Dermatological Examination: Pedal skin with normal turgor, texture and tone b/l. No open wounds. No interdigital macerations b/l. Toenails x10 are 3mm thick, discolored, dystrophic with subungual debris. There is pain with compression of the nail plates.  They are elongated x10  Assessment/Plan: 1. Pain due to onychomycosis of toenail    The mycotic toenails were sharply debrided x10 with sterile nail nippers and a power debriding burr to decrease bulk/thickness and length.    Return in about 9 weeks (around 02/06/2023) for  RFC.   Clerance Lav, DPM, FACFAS [facility]     [address] [place], [place] [address]                Office [phone]  Fax [phone]

## 2023-02-06 ENCOUNTER — Ambulatory Visit: Payer: No Typology Code available for payment source | Admitting: Podiatry

## 2023-02-06 DIAGNOSIS — M79674 Pain in right toe(s): Secondary | ICD-10-CM | POA: Diagnosis not present

## 2023-02-06 DIAGNOSIS — B351 Tinea unguium: Secondary | ICD-10-CM

## 2023-02-06 DIAGNOSIS — M79675 Pain in left toe(s): Secondary | ICD-10-CM | POA: Diagnosis not present

## 2023-02-06 NOTE — Progress Notes (Signed)
       Subjective:  Patient ID: Brian Jennings, male    DOB: Jun 05, 1934,  MRN: [id number]   Brian Jennings presents to clinic today for:  Chief Complaint  Patient presents with   RFC    RFC  . Patient notes nails are thick, discolored, elongated and painful in shoegear when trying to ambulate.    PCP is Wilhemina Bonito., MD.  Past Medical History:  Diagnosis Date   Anxiety    Depression    GERD (gastroesophageal reflux disease)    HTN (hypertension)    Hyperlipidemia    Sleep apnea    treatment with CPAP    Past Surgical History:  Procedure Laterality Date   BACK SURGERY  2012   CYSTOSCOPY PROSTATE W/ LASER  1997   WISDOM TOOTH EXTRACTION  age 82    Allergies  Allergen Reactions   Benztropine     Other reaction(s): Malaise   Sinemet [Carbidopa-Levodopa] Other (See Comments)   Amlodipine Swelling   Clonidine Other (See Comments)   No Known Allergies Swelling    Other reaction(s): Edema of lower extremity    Review of Systems: Negative except as noted in the HPI.  Objective:  Brian Jennings is a pleasant 88 y.o. male in NAD. AAO x 3.  Vascular Examination: Capillary refill time is 3-5 seconds to toes bilateral. Palpable pedal pulses b/l LE. Digital hair present b/l.  Skin temperature gradient WNL b/l. No varicosities b/l. No cyanosis noted b/l.  Mild generalized edema to the lower legs and ankles bilaterally  Dermatological Examination: Pedal skin with normal turgor, texture and tone b/l. No open wounds. No interdigital macerations b/l. Toenails x10 are 3mm thick, discolored, dystrophic with subungual debris. There is pain with compression of the nail plates.  They are elongated x10  Assessment/Plan: 1. Pain due to onychomycosis of toenails of both feet     The mycotic toenails were sharply debrided x10 with sterile nail nippers and a power debriding burr to decrease bulk/thickness and length.    Return in about 9 weeks (around 04/10/2023) for RFC.   Clerance Lav, DPM, FACFAS [facility]     [address] [place], [place] [address]                Office [phone]  Fax [phone]

## 2023-04-10 ENCOUNTER — Ambulatory Visit (INDEPENDENT_AMBULATORY_CARE_PROVIDER_SITE_OTHER): Payer: No Typology Code available for payment source | Admitting: Podiatry

## 2023-04-10 DIAGNOSIS — B351 Tinea unguium: Secondary | ICD-10-CM

## 2023-04-10 DIAGNOSIS — M79675 Pain in left toe(s): Secondary | ICD-10-CM

## 2023-04-10 DIAGNOSIS — M79674 Pain in right toe(s): Secondary | ICD-10-CM | POA: Diagnosis not present

## 2023-04-10 NOTE — Progress Notes (Unsigned)
       Subjective:  Patient ID: Brian Jennings, male    DOB: [birth year],  MRN: [id number]   Jose Corvin presents to clinic today for:  Chief Complaint  Patient presents with   [facility]    RFC with no callous. Not diabetic and takes. Does have parkinsons.    Patient notes nails are thick, discolored, elongated and painful in shoegear when trying to ambulate.    PCP is Wilhemina Bonito., MD.  Past Medical History:  Diagnosis Date   Anxiety    Depression    GERD (gastroesophageal reflux disease)    HTN (hypertension)    Hyperlipidemia    Sleep apnea    treatment with CPAP    Past Surgical History:  Procedure Laterality Date   BACK SURGERY  2012   CYSTOSCOPY PROSTATE W/ LASER  1997   WISDOM TOOTH EXTRACTION  age 36    Allergies  Allergen Reactions   Benztropine     Other reaction(s): Malaise   Sinemet [Carbidopa-Levodopa] Other (See Comments)   Amlodipine Swelling   Clonidine Other (See Comments)   No Known Allergies Swelling    Other reaction(s): Edema of lower extremity    Review of Systems: Negative except as noted in the HPI.  Objective:  Brian Jennings is a pleasant 89 y.o. male in NAD. AAO x 3.  Vascular Examination: Capillary refill time is 3-5 seconds to toes bilateral. Palpable pedal pulses b/l LE. Digital hair present b/l.  Skin temperature gradient WNL b/l. No varicosities b/l. No cyanosis noted b/l.   Dermatological Examination: Pedal skin with normal turgor, texture and tone b/l. No open wounds. No interdigital macerations b/l. Toenails x10 are 3mm thick, discolored, dystrophic with subungual debris. There is pain with compression of the nail plates.  They are elongated x10  Assessment/Plan: 1. Pain due to onychomycosis of toenails of both feet     The mycotic toenails were sharply debrided x10 with sterile nail nippers and a power debriding burr to decrease bulk/thickness and length.    Return in about 9 weeks (around 06/12/2023) for RFC.  Patient  will need a new referral from the [place] prior to his next appointment.   Clerance Lav, DPM, FACFAS [facility]     [address] [place], [place] [address]                Office [phone]  Fax [phone]

## 2023-05-14 DIAGNOSIS — R051 Acute cough: Secondary | ICD-10-CM | POA: Diagnosis not present

## 2023-05-14 DIAGNOSIS — R0981 Nasal congestion: Secondary | ICD-10-CM | POA: Diagnosis not present

## 2023-05-14 DIAGNOSIS — R062 Wheezing: Secondary | ICD-10-CM | POA: Diagnosis not present

## 2023-05-20 DIAGNOSIS — R0981 Nasal congestion: Secondary | ICD-10-CM | POA: Diagnosis not present

## 2023-05-20 DIAGNOSIS — R051 Acute cough: Secondary | ICD-10-CM | POA: Diagnosis not present

## 2023-07-10 ENCOUNTER — Ambulatory Visit (INDEPENDENT_AMBULATORY_CARE_PROVIDER_SITE_OTHER): Payer: Medicare HMO | Admitting: Podiatry

## 2023-07-10 DIAGNOSIS — M79674 Pain in right toe(s): Secondary | ICD-10-CM

## 2023-07-10 DIAGNOSIS — B351 Tinea unguium: Secondary | ICD-10-CM

## 2023-07-10 DIAGNOSIS — M79675 Pain in left toe(s): Secondary | ICD-10-CM

## 2023-07-10 NOTE — Progress Notes (Unsigned)
    Subjective:  Patient ID: Brian Jennings, male    DOB: 11/14/1934,  MRN: [id number]  Brian Jennings presents to clinic today for:  Chief Complaint  Patient presents with   [facility]    RFC with out callous. Not diabetic and takes ASA 81.   Patient notes nails are thick, discolored, elongated and painful in shoegear when trying to ambulate.    PCP is Brian Cool., MD.  Past Medical History:  Diagnosis Date   Anxiety    Depression    GERD (gastroesophageal reflux disease)    HTN (hypertension)    Hyperlipidemia    Sleep apnea    treatment with CPAP   Past Surgical History:  Procedure Laterality Date   BACK SURGERY  2012   CYSTOSCOPY PROSTATE W/ LASER  1997   WISDOM TOOTH EXTRACTION  age 22   Allergies  Allergen Reactions   Benztropine     Other reaction(s): Malaise   Sinemet [Carbidopa-Levodopa] Other (See Comments)   Amlodipine Swelling   Clonidine Other (See Comments)   No Known Allergies Swelling    Other reaction(s): Edema of lower extremity    Review of Systems: Negative except as noted in the HPI.  Objective:  Brian Jennings is a pleasant 89 y.o. male in NAD. AAO x 3.  Vascular Examination: Capillary refill time is 3-5 seconds to toes bilateral. Palpable pedal pulses b/l LE. Digital hair present b/l.  Skin temperature gradient WNL b/l. No varicosities b/l. No cyanosis noted b/l.   Dermatological Examination: Pedal skin with normal turgor, texture and tone b/l. No open wounds. No interdigital macerations b/l. Toenails x10 are 3mm thick, discolored, dystrophic with subungual debris. There is pain with compression of the nail plates.  They are elongated x10  Assessment/Plan: 1. Pain due to onychomycosis of toenails of both feet     The mycotic toenails were sharply debrided x10 with sterile nail nippers and a power debriding burr to decrease bulk/thickness and length.    Return in about 9 weeks (around 09/11/2023) for [facility] / [facility] ([facility]/02/23).   Brian Jennings, DPM, FACFAS [facility]     [address] [place], [place] [address]                Office [phone]  Fax [phone]

## 2023-07-15 DIAGNOSIS — L820 Inflamed seborrheic keratosis: Secondary | ICD-10-CM | POA: Diagnosis not present

## 2023-07-15 DIAGNOSIS — D225 Melanocytic nevi of trunk: Secondary | ICD-10-CM | POA: Diagnosis not present

## 2023-07-15 DIAGNOSIS — L821 Other seborrheic keratosis: Secondary | ICD-10-CM | POA: Diagnosis not present

## 2023-07-15 DIAGNOSIS — D2239 Melanocytic nevi of other parts of face: Secondary | ICD-10-CM | POA: Diagnosis not present

## 2023-09-11 ENCOUNTER — Ambulatory Visit (INDEPENDENT_AMBULATORY_CARE_PROVIDER_SITE_OTHER): Admitting: Podiatry

## 2023-09-11 DIAGNOSIS — M79675 Pain in left toe(s): Secondary | ICD-10-CM | POA: Diagnosis not present

## 2023-09-11 DIAGNOSIS — B351 Tinea unguium: Secondary | ICD-10-CM | POA: Diagnosis not present

## 2023-09-11 DIAGNOSIS — M79674 Pain in right toe(s): Secondary | ICD-10-CM

## 2023-09-11 NOTE — Progress Notes (Signed)
    Subjective:  Patient ID: Brian Jennings, male    DOB: February 18, 1935,  MRN: [id number]  Brian Jennings presents to clinic today for:  Chief Complaint  Patient presents with   rfc    Not diabetic, no anticoag. Nail care.    Patient notes nails are thick, discolored, elongated and painful in shoegear when trying to ambulate.    PCP is Brian Tanda FORBES Mickey., MD.  Past Medical History:  Diagnosis Date   Anxiety    Depression    GERD (gastroesophageal reflux disease)    HTN (hypertension)    Hyperlipidemia    Sleep apnea    treatment with CPAP   Past Surgical History:  Procedure Laterality Date   BACK SURGERY  2012   CYSTOSCOPY PROSTATE W/ LASER  1997   WISDOM TOOTH EXTRACTION  age 74   Allergies  Allergen Reactions   Benztropine Other (See Comments)    Other reaction(s): Malaise   Sinemet [Carbidopa-Levodopa] Other (See Comments)   Amlodipine Swelling   Clonidine Other (See Comments)   No Known Allergies Swelling    Other reaction(s): Edema of lower extremity    Review of Systems: Negative except as noted in the HPI.  Objective:  Brian Jennings is a pleasant 89 y.o. male in NAD. AAO x 3.  Vascular Examination: Capillary refill time is 3-5 seconds to toes bilateral. Trace palpable pedal pulses b/l LE. Digital hair sparse b/l.  Skin temperature gradient WNL b/l.    Dermatological Examination: Pedal skin with normal turgor, texture and tone b/l. No open wounds. No interdigital macerations b/l. Toenails x10 are 3mm thick, discolored, dystrophic with subungual debris. There is pain with compression of the nail plates.  They are elongated x10  Assessment/Plan: 1. Pain due to onychomycosis of toenails of both feet    The mycotic toenails were sharply debrided x10 with sterile nail nippers and a power debriding burr to decrease bulk/thickness and length.    Return in about 3 months (around 12/12/2023) for RFC.   Brian Jennings, DPM, FACFAS [facility]      [address] [place], [place] [address]                Office [phone]  Fax [phone]

## 2024-01-09 ENCOUNTER — Ambulatory Visit: Payer: Self-pay | Admitting: *Deleted

## 2024-01-09 NOTE — Telephone Encounter (Signed)
 Copied from CRM [id number]. Topic: General - Other >> Jan 09, 2024  8:12 AM Antony RAMAN wrote: Reason for CRM: called to a return a call for a nurse he was talking to yesterday, did not want to wait for a callback. Would not give details what conversation was about so I knew where to direct call. Xfer to nurse triage  Unable to find any notes in chart regarding call from yesterday. Does not see provider in PCP network we service.
